# Patient Record
Sex: Male | Born: 1965
Health system: Southern US, Community
[De-identification: ages and names within clinical notes are randomized; demographics above are authoritative.]

## PROBLEM LIST (undated history)

## (undated) DIAGNOSIS — R519 Headache, unspecified: Secondary | ICD-10-CM

## (undated) DIAGNOSIS — E785 Hyperlipidemia, unspecified: Secondary | ICD-10-CM

## (undated) DIAGNOSIS — M1990 Unspecified osteoarthritis, unspecified site: Secondary | ICD-10-CM

## (undated) DIAGNOSIS — G4730 Sleep apnea, unspecified: Secondary | ICD-10-CM

## (undated) DIAGNOSIS — T7840XA Allergy, unspecified, initial encounter: Secondary | ICD-10-CM

## (undated) DIAGNOSIS — K219 Gastro-esophageal reflux disease without esophagitis: Secondary | ICD-10-CM

## (undated) DIAGNOSIS — K759 Inflammatory liver disease, unspecified: Secondary | ICD-10-CM

## (undated) HISTORY — PX: PROSTATE BIOPSY: SHX241

## (undated) HISTORY — DX: Sleep apnea, unspecified: G47.30

## (undated) HISTORY — DX: Hyperlipidemia, unspecified: E78.5

## (undated) HISTORY — PX: OTHER SURGICAL HISTORY: SHX169

## (undated) HISTORY — PX: COLONOSCOPY: SHX174

## (undated) HISTORY — DX: Allergy, unspecified, initial encounter: T78.40XA

---

## 1998-07-10 ENCOUNTER — Encounter: Admission: RE | Admit: 1998-07-10 | Discharge: 1998-07-28 | Payer: Self-pay | Admitting: Anesthesiology

## 2002-01-24 ENCOUNTER — Emergency Department (HOSPITAL_COMMUNITY): Admission: EM | Admit: 2002-01-24 | Discharge: 2002-01-24 | Payer: Self-pay | Admitting: Emergency Medicine

## 2002-03-05 ENCOUNTER — Ambulatory Visit (HOSPITAL_BASED_OUTPATIENT_CLINIC_OR_DEPARTMENT_OTHER): Admission: RE | Admit: 2002-03-05 | Discharge: 2002-03-05 | Payer: Self-pay | Admitting: Otolaryngology

## 2002-06-27 ENCOUNTER — Emergency Department (HOSPITAL_COMMUNITY): Admission: EM | Admit: 2002-06-27 | Discharge: 2002-06-28 | Payer: Self-pay | Admitting: Emergency Medicine

## 2002-10-18 ENCOUNTER — Encounter: Admission: RE | Admit: 2002-10-18 | Discharge: 2002-10-18 | Payer: Self-pay | Admitting: Cardiology

## 2002-10-18 ENCOUNTER — Encounter: Payer: Self-pay | Admitting: Cardiology

## 2005-06-16 ENCOUNTER — Emergency Department (HOSPITAL_COMMUNITY): Admission: EM | Admit: 2005-06-16 | Discharge: 2005-06-16 | Payer: Self-pay | Admitting: Emergency Medicine

## 2005-06-18 ENCOUNTER — Encounter: Admission: RE | Admit: 2005-06-18 | Discharge: 2005-06-18 | Payer: Self-pay | Admitting: Cardiology

## 2005-06-20 ENCOUNTER — Inpatient Hospital Stay (HOSPITAL_COMMUNITY): Admission: AD | Admit: 2005-06-20 | Discharge: 2005-06-23 | Payer: Self-pay | Admitting: Cardiology

## 2006-08-29 ENCOUNTER — Encounter: Admission: RE | Admit: 2006-08-29 | Discharge: 2006-08-29 | Payer: Self-pay | Admitting: Cardiology

## 2011-03-08 NOTE — Discharge Summary (Signed)
[name], Galvan            ACCOUNT NO.:  [id number]   MEDICAL RECORD NO.:  [id number]          PATIENT TYPE:   LOCATION:                                 FACILITY:   PHYSICIAN:  Eric Galvan, M.D.      DATE OF BIRTH:   DATE OF ADMISSION:  06/10/2006  DATE OF DISCHARGE:  05/23/2006                                 DISCHARGE SUMMARY   ADMITTING DIAGNOSES:  1.  Acute hepatitis.  2.  History of hepatitis A.  3.  Dyslipidemia.  4.  Tobacco abuse.   FINAL DIAGNOSES:  1.  Hepatitis C increased LFTs secondary to above.  2.  History of hepatitis A in the past.  3.  Hypercholesteremia controlled by diet, morbid obesity.  4.  Sleep apnea.  5.  Tobacco abuse.   DISCHARGE HOME MEDICATIONS:  1.  Protonix 40 mg 1 tablet daily.  2.  Multivitamin 1 tablet daily.   ACTIVITY:  As tolerated.   DIET:  Low salt, low cholesterol.   Hepatic function panel in 1 week.   FOLLOW UP:  1.  Eric Galvan in 2-4 weeks.  2.  Follow up with Eric Galvan in 1 week.   CONDITION AT DISCHARGE:  Stable.   BRIEF HISTORY AND HOSPITAL COURSE:  Eric Galvan is a 45 year old African  American male with no previous history of liver disease, other than the  history of hepatitis A in 2004.  [facility],  and he was sick for approximately 2 weeks, then got about 2 weeks ago, he  became ill and was beginning to have nausea, bloating, increased his Pepcid,  increased fatigue and pressure-like sensation in lower chest that was  burning consistent with previous reflux symptoms that he had.  This was  worse after eating.  He became somewhat anorexic and fatigued recently.  No  real diarrhea other than a couple of loose stools yesterday.  He works as  Firefighter.  He was in [place] and went to an [facility] in  [place], apparently had x-ray done and some sort of lab work.  He was told  yesterday evening everything was okay.  He was placed on Pepcid.  This was  the  only medication that he has.  He denies any routine medicines.  He  carefully questioned about any over-the-counter medications including herbal  preparation, pain tablets, Tylenol, etc.  He denies all of those.  He denies  using IV drugs, has not traveled outside the contained recent past.  He was  admitted to hospital for further evaluation, as patient was noted to have  elevated bilirubin of 3.9.   CURRENT MEDICATIONS:  Pepcid   ALLERGIES:  PENICILLIN.   He takes no other chronic medication   FAMILY HISTORY:  Both parents are diabetic; no family history of liver  disease.   SOCIAL HISTORY:  He is married and has 2 children.  He smoked 1 pack per  day, drinks alcohol occasionally 1-2 times per week.   PHYSICAL EXAMINATION:  VITAL SIGNS:  On examination, his blood pressure was  119/68.  He was afebrile.  HEENT:  Sclerae was icteric.  Throat normal.  Mucous membranes were moist.  LUNGS:  Clear.  HEART:  Regular rate and rhythm, no murmurs or gallop.  ABDOMEN:  Soft, nontender.  Good bowel sounds.   IMPRESSION:  1.  Abnormal liver function, unknown cause.  2.  Fatigue, malaise, anorexia.  3.  History of hepatitis in the past.   LABORATORY DATA:  Hepatitis B surface antigen was positive.  His total  protein was 6.8.  Serum albumin was 55.6.  Alpha-1 globulin was 5.1 which  was elevated.  Hemoglobin was elevated 19.1.  His hemoglobin was 14.2,  hematocrit 42.6, white count of 6.7, potassium was 3.9, glucose was 102, BUN  7, creatinine 1.2.  His AST was 1055, ALT was 2178, ALP was 171, total bili  was 3.4.  Repeat total bili was 3.9, direct bilirubin was 2.2 and indirect  bilirubin was 1.7.  Hepatic abdominal ultrasound showed a completely  distended gallbladder, however, no evidence of gallstones or biliary  dilatation.  KUB abdominal x-ray showed mild generalized ileus.  His EKG  showed normal sinus rhythm with no acute ischemic changes.   BRIEF HOSPITAL COURSE:  The patient  was admitted to telemetry unit, and GI  consultation was obtained with Eric Galvan.  The patient did not have  any further episodes of abdominal pain, nausea, or vomiting.  The patient  denies any abdominal pain, nausea, vomiting, diarrhea during the hospital  stay.  The patient was advised to be followed up by GI as outpatient and has  appointment with Eric Galvan in 2-4 weeks and will follow up with Eric Galvan  as outpatient in 1 week.           ______________________________  Eric Galvan. Sharyn Galvan, M.D.     [facility]  D:  05/22/2006  T:  05/22/2006  Job:  [id number]   cc:   Eric Galvan, M.D.

## 2011-03-08 NOTE — Discharge Summary (Signed)
NAMESUKHRAJ, ESQUIVIAS             ACCOUNT NO.:  [id number]   MEDICAL RECORD NO.:  [id number]            PATIENT TYPE:   LOCATION:                                 FACILITY:   PHYSICIAN:  Mohan N. Sharyn Lull, M.D.      DATE OF BIRTH:   DATE OF ADMISSION:  DATE OF DISCHARGE:                                 DISCHARGE SUMMARY   ADMISSION DIAGNOSES:  1.  Acute hepatitis.  2.  History of hepatitis A.  3.  Dyslipidemia.  4.  Tobacco abuse.   FINAL DIAGNOSES:  1.  Hepatitis C.  2.  Increased LFT's secondary to above.  3.  Hypercholesterolemia, controlled by diet.  4.  Morbid obesity.  5.  Sleep apnea.  6.  Tobacco abuse.  7.  History of hepatitis A.   DISCHARGE HOME MEDICATIONS:  1.  Protonix 40 mg one tablet daily.  2.  Multivitamins one tablet daily.   ACTIVITY:  As tolerated.   DIET:  Low salt, low cholesterol.   DISCHARGE INSTRUCTIONS:  Hepatic function panel in one week.  Follow with  Dr. Madilyn Fireman in two to four weeks and Dr. Shana Chute in one week.   CONDITION AT DISCHARGE:  Stable.   __________ 45 year old black male was admitted with nausea, anorexia,  weakness for past five days.  Patient also had history of dyslipidemia,  hypertension, generalized weakness lasting four to five days.  Denies any  fever, chills, or vomiting, but has been nauseated.  He has history of  hepatitis A.  He is denies any history of alcohol abuse or drug abuse.  His  symptoms began __________ and progressed __________.           ______________________________  Eric Galvan. Sharyn Lull, M.D.    M[facility]/MED[facility]  D:  05/01/2006  T:  05/02/2006  Job:  [id number]

## 2011-03-08 NOTE — Consult Note (Signed)
Eric Galvan, Eric Galvan             ACCOUNT NO.:  [id number]   MEDICAL RECORD NO.:  [id number]          PATIENT TYPE:  INP   LOCATION:  5148                         FACILITY:  [facility]   PHYSICIAN:  James L. Malon Kindle., M.D.DATE OF BIRTH:  1965-12-22   DATE OF CONSULTATION:  06/21/2005  DATE OF DISCHARGE:                                   CONSULTATION   REASON FOR CONSULTATION:  For abnormal liver test.   HISTORY:  Very nice 45 year old African-American male with no previous  history of liver disease other than a history of hepatitis A in 2004.  This  was diagnosed at an urgent care center, and he was sick for approximately  two weeks then got well.  About two weeks ago, he became ill and was  beginning to have vague nausea, bloating, increasing dyspepsia, water brash  and regurgitation, increasing fatigue, pressure-like sensation in his chest  that was burning consistent with previous reflux symptoms that he had.  It  was worse after eating.  He became somewhat anorexic, fatigued.  No real  diarrhea other than a couple of loose stools yesterday.  He is a Conservation officer, historic buildings.  He was in [place] and went to an urgent care-type center in  [place] and apparently had x-ray done and some sort of lab work.  Was told  everything was okay.  He was placed on Pepcid.  This is the only medication  that he has had.  He adamantly denies any routine medicines.  I carefully  questioned him about any over-the-counter medicines including any herbal  preparations, pain tablets with Tylenol, etc.  He denied all these.  He uses  no IV drugs.  Has not traveled outside the country in the past couple  months.  He has had no sexual or any other exposure to anyone with known  liver disease recently.  He is feeling a little bit better today.  He has  continued to have symptoms.  He was admitted to hospital.  His labs reveal a  total  bilirubin of 3.9, alk phos 166, GOT 990, SPT 2040, albumin level at  3.2.   Cardiac enzymes are negative.  Ultrasound done today showed no dilated  ducts, no gallstones.  Official report is currently pending.   CURRENT MEDICATIONS:  Pepcid, which has only been taken recently.   ALLERGIES:  PENICILLIN.   He takes no other chronic medicines.   MEDICAL HISTORY:  No chronic medical problems.  No previous surgeries.   FAMILY HISTORY:  Both parents have diabetes. No family history of liver  disease.   SOCIAL HISTORY:  He is married and has two children.  He smokes one pack a  day.  Drinks alcohol occasionally one or two times a week or less.   REVIEW OF SYSTEMS:  Unremarkable for sinus problems and allergies.  No  change in bowel habits.  No previous history of ulcers.  He does have  chronic reflux symptoms.   PHYSICAL EXAMINATION:  VITAL SIGNS:  The patient is afebrile.  Blood  pressure 119/68.  GENERAL:  Slightly obese African-American male in no acute  distress.  HEENT:  Eyes:  Sclerae are slightly icteric.  Throat normal.  Mucous  membranes moist.  LUNGS:  Clear.  HEART:  Regular rate and rhythm.  No murmurs or gallops.  ABDOMEN:  Soft, nontender.  Excellent bowel sounds.  The patient indicates  the upper abdomen is where he is having discomfort.   ASSESSMENT:  Abnormal liver test, unknown for cause.  This is associated  with fatigue, malaise, and anorexia.  I think it is probably viral.  It  could be mono or some other virus.  He says he is feeling better.   PLAN:  Will go ahead and obtain hepatitis B and C serologies as well as  Monospot.  Will also obtain ANA, protein electrophoresis as well.  Will  follow him clinically.           ______________________________  Llana Aliment Malon Kindle., M.D.     Waldron Session  D:  06/21/2005  T:  06/21/2005  Job:  [id number]

## 2012-03-23 ENCOUNTER — Encounter: Payer: 59 | Attending: Internal Medicine | Admitting: *Deleted

## 2012-03-23 ENCOUNTER — Encounter: Payer: Self-pay | Admitting: *Deleted

## 2012-03-23 DIAGNOSIS — E119 Type 2 diabetes mellitus without complications: Secondary | ICD-10-CM | POA: Insufficient documentation

## 2012-03-23 DIAGNOSIS — E785 Hyperlipidemia, unspecified: Secondary | ICD-10-CM | POA: Insufficient documentation

## 2012-03-23 DIAGNOSIS — Z713 Dietary counseling and surveillance: Secondary | ICD-10-CM | POA: Insufficient documentation

## 2012-03-23 DIAGNOSIS — I10 Essential (primary) hypertension: Secondary | ICD-10-CM | POA: Insufficient documentation

## 2012-03-23 DIAGNOSIS — E669 Obesity, unspecified: Secondary | ICD-10-CM | POA: Insufficient documentation

## 2012-03-23 NOTE — Patient Instructions (Addendum)
Goals:  Aim for 4 - 5 Carb Choices per meal  (60 - 75 grams carbohydrate/meal  Aim for 0-2 Carb Choices per snack  ( 0-30 grams carbohydrate/snack  Ask insurance about choice of strips if you decide to monitor glucose levels   Aim for 30 mins of physical activity daily as tolerated  Use calendar to plan times to swim several times a week

## 2012-03-23 NOTE — Progress Notes (Signed)
  Medical Nutrition Therapy:  Appt start time: 1130 end time:  1230.  Assessment:  Primary concerns today: patient here for assistance with weight loss and diabetes management education. He states he was diagnosed about 4 years ago and this is his first diabetes education he has received. He has a meter at home but has not tested his BG yet. He works at [employer] 50-60 hours a week.   MEDICATIONS: see list. Diabetes medication is Janumet daily   DIETARY INTAKE:  Usual eating pattern includes 2 meals and 0-2 snacks per day.  Everyday foods include fair variety of most food groups with large portions.  Avoided foods include milk.    24-hr recall:  B ( AM): skip  Snk ( AM): none  L ( PM): eat out- all fast food, water or occasionally soda Snk ( PM): occasional PNB crackers x 1 pkg D ( PM): eat out 50% of time, or wife cooks - meat, 2 veg, bread, water Snk ( PM): occasionally cookies OR chips OR PNB crackers, peanuts Beverages: water mostly, occasional reg or soda or sweet tea  Usual physical activity: not now, would like to start  Estimated energy needs: 2000 calories 225 g carbohydrates 150 g protein 56 g fat  Progress Towards Goal(s):  In progress.   Nutritional Diagnosis:  NI-1.5 Excessive energy intake As related to activity level.  As evidenced by BMI of 37.2% .    Intervention:  Nutrition counseling and diabetes education provided. Discussed basic physiology of diabetes, rationale of SMBG, Carb Counting and reading food labels. Also encouraged him to initiate his choice of exercise which he plans to be swimming at the [facility]. Goals:  Aim for 4 - 5 Carb Choices per meal  (60 - 75 grams carbohydrate/meal  Aim for 0-2 Carb Choices per snack  ( 0-30 grams carbohydrate/snack  Ask insurance about choice of strips if you decide to monitor glucose levels   Aim for 30 mins of physical activity daily as tolerated  Use calendar to plan times to swim several times a  week  Handouts given during visit include: Living Well with Diabetes Carb Counting and Food Label handouts Meal Plan Card  Diabetes Medication List  Monitoring/Evaluation:  Dietary intake, exercise, reading food labels, and body weight prn.

## 2013-02-17 ENCOUNTER — Ambulatory Visit (INDEPENDENT_AMBULATORY_CARE_PROVIDER_SITE_OTHER): Payer: 59 | Admitting: Physician Assistant

## 2013-02-17 VITALS — BP 128/72 | HR 106 | Temp 99.8°F | Resp 16 | Ht 73.5 in | Wt 300.0 lb

## 2013-02-17 DIAGNOSIS — E785 Hyperlipidemia, unspecified: Secondary | ICD-10-CM | POA: Insufficient documentation

## 2013-02-17 DIAGNOSIS — J029 Acute pharyngitis, unspecified: Secondary | ICD-10-CM

## 2013-02-17 DIAGNOSIS — E119 Type 2 diabetes mellitus without complications: Secondary | ICD-10-CM | POA: Insufficient documentation

## 2013-02-17 DIAGNOSIS — J309 Allergic rhinitis, unspecified: Secondary | ICD-10-CM

## 2013-02-17 MED ORDER — AMBULATORY NON FORMULARY MEDICATION: Status: DC

## 2013-02-17 MED ORDER — IPRATROPIUM BROMIDE 0.03 % NA SOLN: 2.0000 | Freq: Two times a day (BID) | NASAL | Status: DC

## 2013-02-17 MED ORDER — GUAIFENESIN ER 1200 MG PO TB12: 1.0000 | ORAL_TABLET | Freq: Two times a day (BID) | ORAL | Status: DC | PRN

## 2013-02-17 NOTE — Progress Notes (Signed)
  Subjective:    Patient ID: Eric Galvan, male    DOB: 10-10-1966, 47 y.o.   MRN: [id number]  HPI This 47 y.o. male presents for evaluation of sore throat x 1 week.  Some mild cough.  Denies post-nasal drainage, runny nose, stuffy nose, ear fullness/pressure/pain, HA, but then states, "My allergies have been terrible this year." No GI/GU symptoms. Denies heartburn/indigestion.  Diabetes is managed by his PCP. Doesn't check glucose at home.  "I can always tell when it's out of whack."  He reports getting really tired with elevated glucose, which he addresses by going to bed.  Has seen a DDS in the past 6 month and eye specialist in the past year.  Review of Systems Denies chest pain, shortness of breath, HA, dizziness, vision change, nausea, vomiting, diarrhea, constipation, melena, hematochezia, dysuria, increased urinary urgency or frequency, increased hunger or thirst, unintentional weight change, unexplained myalgias or arthralgias, rash.     Objective:   Physical Exam Blood pressure 128/72, pulse 106, temperature 99.8 F (37.7 C), resp. rate 16, height 6' 1.5" (1.867 m), weight 300 lb (136.079 kg). Body mass index is 39.04 kg/(m^2). Well-developed, well nourished BM who is awake, alert and oriented, in NAD. HEENT: /AT, PERRL, EOMI.  Sclera and conjunctiva are clear.  EAC are patent, TMs are normal in appearance. Nasal mucosa is pink and moist. OP is clear. Neck: supple, non-tender, no lymphadenopathy, thyromegaly. Heart: RRR, no murmur Lungs: normal effort, CTA Abdomen: normo-active bowel sounds, supple, non-tender, no mass or organomegaly. Extremities: no cyanosis, clubbing or edema. Skin: warm and dry without rash. Psychologic: good mood and appropriate affect, normal speech and behavior.  Results for orders placed in visit on 02/17/13  POCT RAPID STREP A (OFFICE)      Result Value Range   Rapid Strep A Screen Negative  Negative       Assessment & Plan:  Acute  pharyngitis - Plan: POCT rapid strep A, Culture, Group A Strep, Hurricaine Cocktail  Allergic rhinitis - Plan: ipratropium (ATROVENT) 0.03 % nasal spray, Guaifenesin (MUCINEX MAXIMUM STRENGTH) 1200 MG TB12  Fernande Bras, PA-C Physician Assistant-Certified [facility]

## 2013-02-17 NOTE — Patient Instructions (Signed)
Get plenty of rest and drink at least 64 ounces of water daily. Use an OTC antihistamine (Allegra, Claritin or Zyrtec).

## 2013-02-19 LAB — CULTURE, GROUP A STREP: Organism ID, Bacteria: NORMAL

## 2013-06-14 ENCOUNTER — Encounter: Payer: Self-pay | Admitting: Gastroenterology

## 2013-07-09 ENCOUNTER — Ambulatory Visit (INDEPENDENT_AMBULATORY_CARE_PROVIDER_SITE_OTHER): Payer: 59 | Admitting: Gastroenterology

## 2013-07-09 ENCOUNTER — Encounter: Payer: Self-pay | Admitting: Gastroenterology

## 2013-07-09 VITALS — BP 110/62 | HR 88 | Ht 73.0 in | Wt 290.1 lb

## 2013-07-09 DIAGNOSIS — R1310 Dysphagia, unspecified: Secondary | ICD-10-CM

## 2013-07-09 NOTE — Assessment & Plan Note (Signed)
Progressive dysphagia to solids.  Suspect esophageal stricture.  Candida esophagitis is a consideration as well.  Recommendations #1 upper endoscopy with dilatation as indicated  Risks, alternatives, and complications of the procedure, including bleeding, perforation, and possible need for surgery, were explained to the patient.  Patient's questions were answered.

## 2013-07-09 NOTE — Patient Instructions (Addendum)
You have been scheduled for an endoscopy with propofol. Please follow written instructions given to you at your visit today. If you use inhalers (even only as needed), please bring them with you on the day of your procedure. Your physician has requested that you go to www.startemmi.com and enter the access code given to you at your visit today. This web site gives a general overview about your procedure. However, you should still follow specific instructions given to you by our office regarding your preparation for the procedure.  Hold your oral diabetic medications the morning of your procedure

## 2013-07-09 NOTE — Progress Notes (Signed)
History of Present Illness: Pleasant 46 yo Afro-American male with diabetes and sleep apnea referred for evaluation of dysphagia.  He has dysphagia to solids.  He occasionally has odynophagia.  This is a worsening problem for the past 3-4 months.  He denies pyrosis, per se.  Weight is stable.    Past Medical History  Diagnosis Date  . Diabetes mellitus   . Hyperlipidemia   . Sleep apnea   . Allergy    Past Surgical History  Procedure Laterality Date  . Cluster headaches     family history includes Alzheimer's disease in his mother; Cancer in his other; Diabetes in his father and mother; Hypertension in his other. Current Outpatient Prescriptions  Medication Sig Dispense Refill  . AMBULATORY NON FORMULARY MEDICATION MIX: 90 ml Cherry Mylanta, 90 ml Cherry Benadryl, 30 ml Cherry Hurricane liquid.  5-10 ml PO swish and spit or swallow, Q2 hours, prn sore throat.  210 mL  1  . aspirin 81 MG tablet Take 81 mg by mouth daily.      Marland Kitchen atorvastatin (LIPITOR) 80 MG tablet       . clomiPHENE (CLOMID) 50 MG tablet       . ipratropium (ATROVENT) 0.03 % nasal spray Place 2 sprays into the nose 2 (two) times daily.  30 mL  0  . NEXIUM 40 MG capsule       . sitaGLIPtan-metformin (JANUMET) 50-1000 MG per tablet Take 1 tablet by mouth 2 (two) times daily with a meal.       . VIAGRA 100 MG tablet        No current facility-administered medications for this visit.   Allergies as of 07/09/2013 - Review Complete 07/09/2013  Allergen Reaction Noted  . Penicillins  03/23/2012    reports that he quit smoking about 11 years ago. He has never used smokeless tobacco. He reports that he drinks about 1.0 ounces of alcohol per week. His drug history is not on file.     Review of Systems: Pertinent positive and negative review of systems were noted in the above HPI section. All other review of systems were otherwise negative.  Vital signs were reviewed in today's medical record Physical Exam: General:  Well developed , well nourished, no acute distress Skin: anicteric Head: Normocephalic and atraumatic Eyes:  sclerae anicteric, EOMI Ears: Normal auditory acuity Mouth: No deformity or lesions Neck: Supple, no masses or thyromegaly Lungs: Clear throughout to auscultation Heart: Regular rate and rhythm; no murmurs, rubs or bruits Abdomen: Soft, non tender and non distended. No masses, hepatosplenomegaly or hernias noted. Normal Bowel sounds Rectal:deferred Musculoskeletal: Symmetrical with no gross deformities  Skin: No lesions on visible extremities Pulses:  Normal pulses noted Extremities: No clubbing, cyanosis, edema or deformities noted Neurological: Alert oriented x 4, grossly nonfocal Cervical Nodes:  No significant cervical adenopathy Inguinal Nodes: No significant inguinal adenopathy Psychological:  Alert and cooperative. Normal mood and affect

## 2013-07-12 ENCOUNTER — Encounter: Payer: Self-pay | Admitting: Gastroenterology

## 2013-08-03 ENCOUNTER — Encounter: Payer: Self-pay | Admitting: Gastroenterology

## 2013-08-03 ENCOUNTER — Ambulatory Visit (AMBULATORY_SURGERY_CENTER): Payer: 59 | Admitting: Gastroenterology

## 2013-08-03 VITALS — BP 124/72 | HR 77 | Temp 96.5°F | Resp 34 | Ht 73.0 in | Wt 290.0 lb

## 2013-08-03 DIAGNOSIS — R1310 Dysphagia, unspecified: Secondary | ICD-10-CM

## 2013-08-03 MED ORDER — SODIUM CHLORIDE 0.9 % IV SOLN: 500.0000 mL | INTRAVENOUS | Status: DC

## 2013-08-03 NOTE — Progress Notes (Signed)
Called to room to assist during endoscopic procedure.  Patient ID and intended procedure confirmed with present staff. Received instructions for my participation in the procedure from the performing physician.  

## 2013-08-03 NOTE — Progress Notes (Signed)
Patient did not experience any of the following events: a burn prior to discharge; a fall within the facility; wrong site/side/patient/procedure/implant event; or a hospital transfer or hospital admission upon discharge from the facility. (G8907) Patient did not have preoperative order for IV antibiotic SSI prophylaxis. (G8918)  

## 2013-08-03 NOTE — Op Note (Addendum)
[facility] [address] [place], [address]   ENDOSCOPY PROCEDURE REPORT  PATIENT: Eric, Galvan  MR#: [id number] BIRTHDATE: 1966-05-07 , 47  yrs. old GENDER: Male ENDOSCOPIST: Louis Meckel, MD REFERRED BY:  Jackie Plum, M.D. PROCEDURE DATE:  08/03/2013 PROCEDURE:  EGD, diagnostic and Maloney dilation of esophagus ASA CLASS:     Class II INDICATIONS:  Dysphagia. MEDICATIONS: MAC sedation, administered by CRNA, propofol (Diprivan) 200mg  IV, and Simethicone 0.6cc PO TOPICAL ANESTHETIC: Cetacaine Spray  DESCRIPTION OF PROCEDURE: After the risks benefits and alternatives of the procedure were thoroughly explained, informed consent was obtained.  The LB [id number] endoscope was introduced through the mouth and advanced to the third portion of the duodenum. Without limitations.  The instrument was slowly withdrawn as the mucosa was fully examined.      The upper, middle and distal third of the esophagus were carefully inspected and no abnormalities were noted.  The z-line was well seen at the GEJ.  The endoscope was pushed into the fundus which was normal including a retroflexed view.  The antrum, gastric body, first and second part of the duodenum were unremarkable. Retroflexed views revealed no abnormalities.   Because of complains of dysphagia to solids a #52 [place] Maloney dilator was passed. There was mild resistance but no heme.  The scope was then withdrawn from the patient and the procedure completed.  COMPLICATIONS: There were no complications. ENDOSCOPIC IMPRESSION: Normal EGD - status post Maloney dilation  RECOMMENDATIONS: office visit one month REPEAT EXAM:  eSigned:  Louis Meckel, MD 09/10/2013 10:53 AM Revised: 09/10/2013 10:53 AM  CC:

## 2013-08-03 NOTE — Patient Instructions (Signed)
Discharge instructions given with verbal understanding. Handout on a dilatation diet. Resume previous medication. YOU HAD AN ENDOSCOPIC PROCEDURE TODAY AT THE [facility]: Refer to the procedure report that was given to you for any specific questions about what was found during the examination.  If the procedure report does not answer your questions, please call your gastroenterologist to clarify.  If you requested that your care partner not be given the details of your procedure findings, then the procedure report has been included in a sealed envelope for you to review at your convenience later.  YOU SHOULD EXPECT: Some feelings of bloating in the abdomen. Passage of more gas than usual.  Walking can help get rid of the air that was put into your GI tract during the procedure and reduce the bloating. If you had a lower endoscopy (such as a colonoscopy or flexible sigmoidoscopy) you may notice spotting of blood in your stool or on the toilet paper. If you underwent a bowel prep for your procedure, then you may not have a normal bowel movement for a few days.  DIET: Your first meal following the procedure should be a light meal and then it is ok to progress to your normal diet.  A half-sandwich or bowl of soup is an example of a good first meal.  Heavy or fried foods are harder to digest and may make you feel nauseous or bloated.  Likewise meals heavy in dairy and vegetables can cause extra gas to form and this can also increase the bloating.  Drink plenty of fluids but you should avoid alcoholic beverages for 24 hours.  ACTIVITY: Your care partner should take you home directly after the procedure.  You should plan to take it easy, moving slowly for the rest of the day.  You can resume normal activity the day after the procedure however you should NOT DRIVE or use heavy machinery for 24 hours (because of the sedation medicines used during the test).    SYMPTOMS TO REPORT IMMEDIATELY: A  gastroenterologist can be reached at any hour.  During normal business hours, 8:30 AM to 5:00 PM Monday through Friday, call [phone].  After hours and on weekends, please call the GI answering service at [phone] who will take a message and have the physician on call contact you.   Following upper endoscopy (EGD)  Vomiting of blood or coffee ground material  New chest pain or pain under the shoulder blades  Painful or persistently difficult swallowing  New shortness of breath  Fever of 100F or higher  Black, tarry-looking stools  FOLLOW UP: If any biopsies were taken you will be contacted by phone or by letter within the next 1-3 weeks.  Call your gastroenterologist if you have not heard about the biopsies in 3 weeks.  Our staff will call the home number listed on your records the next business day following your procedure to check on you and address any questions or concerns that you may have at that time regarding the information given to you following your procedure. This is a courtesy call and so if there is no answer at the home number and we have not heard from you through the emergency physician on call, we will assume that you have returned to your regular daily activities without incident.  SIGNATURES/CONFIDENTIALITY: You and/or your care partner have signed paperwork which will be entered into your electronic medical record.  These signatures attest to the fact that that the information above   on your After Visit Summary has been reviewed and is understood.  Full responsibility of the confidentiality of this discharge information lies with you and/or your care-partner. 

## 2013-08-04 ENCOUNTER — Telehealth: Payer: Self-pay | Admitting: *Deleted

## 2013-08-04 NOTE — Telephone Encounter (Signed)
  Follow up Call-  Call back number 08/03/2013  Post procedure Call Back phone  # [phone]  Permission to leave phone message Yes     Patient questions:  Do you have a fever, pain , or abdominal swelling? no Pain Score  0 *  Have you tolerated food without any problems? yes  Have you been able to return to your normal activities? yes  Do you have any questions about your discharge instructions: Diet   no Medications  no Follow up visit  no  Do you have questions or concerns about your Care? no  Actions: * If pain score is 4 or above: No action needed, pain <4.

## 2013-09-10 ENCOUNTER — Encounter: Payer: Self-pay | Admitting: Gastroenterology

## 2013-09-10 ENCOUNTER — Ambulatory Visit (INDEPENDENT_AMBULATORY_CARE_PROVIDER_SITE_OTHER): Payer: 59 | Admitting: Gastroenterology

## 2013-09-10 VITALS — BP 104/60 | HR 96 | Ht 72.25 in | Wt 289.5 lb

## 2013-09-10 DIAGNOSIS — Z1211 Encounter for screening for malignant neoplasm of colon: Secondary | ICD-10-CM

## 2013-09-10 DIAGNOSIS — R1310 Dysphagia, unspecified: Secondary | ICD-10-CM

## 2013-09-10 NOTE — Patient Instructions (Signed)
Dr Arlyce Dice has recommended you have a colonoscopy in 1 year

## 2013-09-10 NOTE — Progress Notes (Signed)
History of Present Illness:  The patient has returned following upper endoscopy with dilatation.  No frank stricture was seen but he was dilated with a 52 [place] Maloney dilator.  He reports complete resolution of dysphagia.  His only complaint is an occasional gurgling sound that he thinks is in his upper abdomen or lower chest.  He is without pyrosis.   Review of Systems: Pertinent positive and negative review of systems were noted in the above HPI section. All other review of systems were otherwise negative.    Current Medications, Allergies, Past Medical History, Past Surgical History, Family History and Social History were reviewed in [employer] electronic medical record  Vital signs were reviewed in today's medical record. Physical Exam: General: Well developed , well nourished, no acute distress

## 2013-09-10 NOTE — Assessment & Plan Note (Signed)
Plan repeat dilatation as needed 

## 2013-09-10 NOTE — Assessment & Plan Note (Signed)
I recommended screening colonoscopy sometime within the next year

## 2014-09-19 ENCOUNTER — Encounter: Payer: Self-pay | Admitting: Podiatry

## 2014-09-19 ENCOUNTER — Ambulatory Visit (INDEPENDENT_AMBULATORY_CARE_PROVIDER_SITE_OTHER): Payer: 59 | Admitting: Podiatry

## 2014-09-19 VITALS — BP 143/60 | HR 85 | Resp 14 | Ht 73.0 in | Wt 290.0 lb

## 2014-09-19 DIAGNOSIS — Q828 Other specified congenital malformations of skin: Secondary | ICD-10-CM

## 2014-09-19 DIAGNOSIS — E119 Type 2 diabetes mellitus without complications: Secondary | ICD-10-CM

## 2014-09-19 NOTE — Progress Notes (Signed)
   Subjective:    Patient ID: Eric Galvan, male    DOB: 1966/09/14, 48 y.o.   MRN: [id number]  HPI Comments: N callous L left 5th plantar MPJ D and O long term C hard painful skin A diabetes, and weightbearing T home pedicure  Pt states he noticed a change in the two big toenails, discoloration and splitting, and cutting sensation at the base for over 1 month.     Review of Systems  All other systems reviewed and are negative.      Objective:   Physical Exam  Orientated 3  Vascular: DP pulses 2/4 bilaterally PT pulses 2/4 bilaterally  Neurological: Sensation to 10 g monofilament wire intact 5/5 bilaterally Vibratory sensation intact bilaterally Ankle reflex equal and reactive bilaterally  Dermatological: Transverse ridge right hallux nail plate Striping left hallux nail plate Punctate keratoses in the center of diffuse keratoses plantar fifth left MPJ  Musculoskeletal: HAV deformity left      Assessment & Plan:   Assessment: Satisfactory neurovascular status Porokeratosis fifth left MPJ Reactive plantar keratoses fifth left MPJ Transverse ridge right hallux nail plate noted without any clinical significance Striping left hallux nail plate without any clinical significance  Plan: Patient advised he has satisfactory neurovascular status I did debride the plantar keratoses and packed with salinocaine without a bleeding  I recommended a accommodative prescription shoe insert with a pocket to offload the plantar fifth MPJ corn on a regular basis. Patient said that he did not want to do this and declined the orthotic. I did encourage him to consider this option as it would reduce the discomfort in this area. Also, I advised patient not to sharp debrided the plantar keratoses himself.  I dispensed a surgical felt pad with a cut out to offload the fifth left MPJ and demonstrated how to place the padding in a insole of the shoe for a trial of an accommodative  orthotic.

## 2014-09-19 NOTE — Patient Instructions (Signed)
Use callus file or pumice stone on callus on right foot Attach cut out felt pad to insole of shoe to offload the callus on the right foot Diabetes and Foot Care Diabetes may cause you to have problems because of poor blood supply (circulation) to your feet and legs. This may cause the skin on your feet to become thinner, break easier, and heal more slowly. Your skin may become dry, and the skin may peel and crack. You may also have nerve damage in your legs and feet causing decreased feeling in them. You may not notice minor injuries to your feet that could lead to infections or more serious problems. Taking care of your feet is one of the most important things you can do for yourself.  HOME CARE INSTRUCTIONS  Wear shoes at all times, even in the house. Do not go barefoot. Bare feet are easily injured.  Check your feet daily for blisters, cuts, and redness. If you cannot see the bottom of your feet, use a mirror or ask someone for help.  Wash your feet with warm water (do not use hot water) and mild soap. Then pat your feet and the areas between your toes until they are completely dry. Do not soak your feet as this can dry your skin.  Apply a moisturizing lotion or petroleum jelly (that does not contain alcohol and is unscented) to the skin on your feet and to dry, brittle toenails. Do not apply lotion between your toes.  Trim your toenails straight across. Do not dig under them or around the cuticle. File the edges of your nails with an emery board or nail file.  Do not cut corns or calluses or try to remove them with medicine.  Wear clean socks or stockings every day. Make sure they are not too tight. Do not wear knee-high stockings since they may decrease blood flow to your legs.  Wear shoes that fit properly and have enough cushioning. To break in new shoes, wear them for just a few hours a day. This prevents you from injuring your feet. Always look in your shoes before you put them on to be  sure there are no objects inside.  Do not cross your legs. This may decrease the blood flow to your feet.  If you find a minor scrape, cut, or break in the skin on your feet, keep it and the skin around it clean and dry. These areas may be cleansed with mild soap and water. Do not cleanse the area with peroxide, alcohol, or iodine.  When you remove an adhesive bandage, be sure not to damage the skin around it.  If you have a wound, look at it several times a day to make sure it is healing.  Do not use heating pads or hot water bottles. They may burn your skin. If you have lost feeling in your feet or legs, you may not know it is happening until it is too late.  Make sure your health care provider performs a complete foot exam at least annually or more often if you have foot problems. Report any cuts, sores, or bruises to your health care provider immediately. SEEK MEDICAL CARE IF:   You have an injury that is not healing.  You have cuts or breaks in the skin.  You have an ingrown nail.  You notice redness on your legs or feet.  You feel burning or tingling in your legs or feet.  You have pain or cramps in  your legs and feet.  Your legs or feet are numb.  Your feet always feel cold. SEEK IMMEDIATE MEDICAL CARE IF:   There is increasing redness, swelling, or pain in or around a wound.  There is a red line that goes up your leg.  Pus is coming from a wound.  You develop a fever or as directed by your health care provider.  You notice a bad smell coming from an ulcer or wound. Document Released: 10/04/2000 Document Revised: 06/09/2013 Document Reviewed: 03/16/2013 [facility] Patient Information 2015 [place], [place]. This information is not intended to replace advice given to you by your health care provider. Make sure you discuss any questions you have with your health care provider.

## 2014-09-20 ENCOUNTER — Encounter: Payer: Self-pay | Admitting: Podiatry

## 2015-10-31 ENCOUNTER — Telehealth: Payer: Self-pay

## 2015-10-31 DIAGNOSIS — R079 Chest pain, unspecified: Secondary | ICD-10-CM

## 2015-10-31 NOTE — Telephone Encounter (Signed)
-----   Message from Belva Crome, MD sent at 10/30/2015 11:55 AM EST ----- Regarding: Needs OV and stress cardiolite scheduled ASAP Hi Golda Zavalza, Dr. Warnell Bureau has referred Mr. Shuey for recurring chest pain. He is diabetic and needs a stress cardiolite ASAP and OV   HS

## 2015-10-31 NOTE — Telephone Encounter (Signed)
Spoke with pt. Pt aware of Dr.Smith's message below. Pt agreeable to have stress myoview and work in consult appt with Dr.Smith after. Adv pt that a scheduler from our office will call him to scheduled his myoview. Pt rqst I leave a detailed message on his voicemail with his pre-test instructions.  Adv pt that we will call back to schedule his consult after his myoview appt is made. Pt verbalized understanding.

## 2015-11-01 ENCOUNTER — Telehealth (HOSPITAL_COMMUNITY): Payer: Self-pay | Admitting: *Deleted

## 2015-11-01 NOTE — Telephone Encounter (Signed)
Pt last o/v with Dr.Schooler requested from [place]. Spoke with [employer], she sts that pt has not been seen in over 2 years, she will fax over what she has

## 2015-11-01 NOTE — Telephone Encounter (Signed)
The patient was referred by Dr. Warnell Bureau, [facility]. The patient has been having recurring chest fullness particularly postprandial. The patient has multiple risk factors for CAD including diabetes mellitus, hypertension, obesity, family history of CAD, and hyperlipidemia. After speaking with Dr. Michail Sermon he has referred the patient for cardiac evaluation. He needs to have upper GI endoscopy but we need to rule out ischemic heart disease prior. Therefore, I am recommending a myocardial perfusion study to exclude CAD.

## 2015-11-01 NOTE — Telephone Encounter (Signed)
-----   Message from Jeanie Sewer sent at 10/31/2015  2:31 PM EST ----- Regarding: RE: pt needs stress myoview 01/12 @ 12:30 part 1 01/13 @ 11:30 part 2 Due to weight this is a 2 day test.  Thanks  Davy Pique  ----- Message -----    From: Lamar Laundry, CMA    Sent: 10/31/2015   1:59 PM      To: [facility] Subject: pt needs stress myoview                        Per Dr.Smith Please call pt to scheduled a stress myoview, asap.

## 2015-11-01 NOTE — Telephone Encounter (Signed)
Patient given detailed instructions per Myocardial Perfusion Study Information Sheet for the test on 11/02/15 at 1230. Patient notified to arrive 15 minutes early and that it is imperative to arrive on time for appointment to keep from having the test rescheduled.  If you need to cancel or reschedule your appointment, please call the office within 24 hours of your appointment. Failure to do so may result in a cancellation of your appointment, and a $50 no show fee. Patient verbalized understanding.Maize Brittingham, Ranae Palms

## 2015-11-02 ENCOUNTER — Ambulatory Visit (HOSPITAL_COMMUNITY): Payer: 59 | Attending: Cardiology

## 2015-11-02 DIAGNOSIS — R079 Chest pain, unspecified: Secondary | ICD-10-CM | POA: Diagnosis present

## 2015-11-02 DIAGNOSIS — E119 Type 2 diabetes mellitus without complications: Secondary | ICD-10-CM | POA: Diagnosis not present

## 2015-11-02 MED ORDER — TECHNETIUM TC 99M SESTAMIBI GENERIC - CARDIOLITE
30.9000 | Freq: Once | INTRAVENOUS | Status: AC | PRN
  Administered 2015-11-02: 30.9 via INTRAVENOUS

## 2015-11-03 ENCOUNTER — Ambulatory Visit (HOSPITAL_COMMUNITY): Payer: 59 | Attending: Cardiovascular Disease

## 2015-11-03 LAB — MYOCARDIAL PERFUSION IMAGING
CHL CUP MPHR: 169 {beats}/min
CHL CUP NUCLEAR SSS: 3
CSEPED: 7 min
CSEPEDS: 16 s
CSEPEW: 8.9 METS
CSEPPBP: 92 mmHg
LHR: 0.28
LVDIAVOL: 143 mL
LVSYSVOL: 62 mL
NUC STRESS TID: 0.93
Peak HR: 157 {beats}/min
Percent HR: 92 %
RPE: 19
Rest HR: 90 {beats}/min
SDS: 2
SRS: 1

## 2015-11-03 MED ORDER — TECHNETIUM TC 99M SESTAMIBI GENERIC - CARDIOLITE
30.5000 | Freq: Once | INTRAVENOUS | Status: AC | PRN
  Administered 2015-11-03: 31 via INTRAVENOUS

## 2015-11-06 NOTE — Telephone Encounter (Signed)
Pt aware of myoview results. Stress study is low risk. Overall very reassuring. We will fwd results to Dr. Warnell Bureau. Pt voiced appreciation and verbalized understanding.

## 2015-11-06 NOTE — Telephone Encounter (Signed)
-----   Message from Belva Crome, MD sent at 11/04/2015 11:11 AM EST ----- Stress study is low risk. Overall very reassuring. Please forward results to Dr. Warnell Bureau.

## 2016-10-28 DIAGNOSIS — J019 Acute sinusitis, unspecified: Secondary | ICD-10-CM | POA: Diagnosis not present

## 2016-10-28 DIAGNOSIS — R05 Cough: Secondary | ICD-10-CM | POA: Diagnosis not present

## 2016-10-28 DIAGNOSIS — J22 Unspecified acute lower respiratory infection: Secondary | ICD-10-CM | POA: Diagnosis not present

## 2016-10-31 DIAGNOSIS — J329 Chronic sinusitis, unspecified: Secondary | ICD-10-CM | POA: Diagnosis not present

## 2016-10-31 DIAGNOSIS — E785 Hyperlipidemia, unspecified: Secondary | ICD-10-CM | POA: Diagnosis not present

## 2016-10-31 DIAGNOSIS — E119 Type 2 diabetes mellitus without complications: Secondary | ICD-10-CM | POA: Diagnosis not present

## 2016-10-31 DIAGNOSIS — R05 Cough: Secondary | ICD-10-CM | POA: Diagnosis not present

## 2017-04-22 DIAGNOSIS — K219 Gastro-esophageal reflux disease without esophagitis: Secondary | ICD-10-CM | POA: Diagnosis not present

## 2017-06-02 DIAGNOSIS — Z131 Encounter for screening for diabetes mellitus: Secondary | ICD-10-CM | POA: Diagnosis not present

## 2017-06-02 DIAGNOSIS — E119 Type 2 diabetes mellitus without complications: Secondary | ICD-10-CM | POA: Diagnosis not present

## 2017-06-02 DIAGNOSIS — Z136 Encounter for screening for cardiovascular disorders: Secondary | ICD-10-CM | POA: Diagnosis not present

## 2017-06-02 DIAGNOSIS — E785 Hyperlipidemia, unspecified: Secondary | ICD-10-CM | POA: Diagnosis not present

## 2017-06-02 DIAGNOSIS — Z0110 Encounter for examination of ears and hearing without abnormal findings: Secondary | ICD-10-CM | POA: Diagnosis not present

## 2017-06-02 DIAGNOSIS — Z125 Encounter for screening for malignant neoplasm of prostate: Secondary | ICD-10-CM | POA: Diagnosis not present

## 2017-06-02 DIAGNOSIS — E1140 Type 2 diabetes mellitus with diabetic neuropathy, unspecified: Secondary | ICD-10-CM | POA: Diagnosis not present

## 2017-06-02 DIAGNOSIS — Z0000 Encounter for general adult medical examination without abnormal findings: Secondary | ICD-10-CM | POA: Diagnosis not present

## 2017-10-15 DIAGNOSIS — G4733 Obstructive sleep apnea (adult) (pediatric): Secondary | ICD-10-CM | POA: Diagnosis not present

## 2017-11-14 DIAGNOSIS — E119 Type 2 diabetes mellitus without complications: Secondary | ICD-10-CM | POA: Diagnosis not present

## 2017-11-14 DIAGNOSIS — I10 Essential (primary) hypertension: Secondary | ICD-10-CM | POA: Diagnosis not present

## 2017-11-14 DIAGNOSIS — E785 Hyperlipidemia, unspecified: Secondary | ICD-10-CM | POA: Diagnosis not present

## 2017-12-04 DIAGNOSIS — E119 Type 2 diabetes mellitus without complications: Secondary | ICD-10-CM | POA: Diagnosis not present

## 2017-12-04 DIAGNOSIS — E1140 Type 2 diabetes mellitus with diabetic neuropathy, unspecified: Secondary | ICD-10-CM | POA: Diagnosis not present

## 2017-12-04 DIAGNOSIS — E785 Hyperlipidemia, unspecified: Secondary | ICD-10-CM | POA: Diagnosis not present

## 2017-12-04 DIAGNOSIS — I10 Essential (primary) hypertension: Secondary | ICD-10-CM | POA: Diagnosis not present

## 2018-03-26 DIAGNOSIS — D170 Benign lipomatous neoplasm of skin and subcutaneous tissue of head, face and neck: Secondary | ICD-10-CM | POA: Diagnosis not present

## 2018-03-26 DIAGNOSIS — J208 Acute bronchitis due to other specified organisms: Secondary | ICD-10-CM | POA: Diagnosis not present

## 2018-04-08 ENCOUNTER — Other Ambulatory Visit: Payer: Self-pay | Admitting: Physician Assistant

## 2018-04-08 DIAGNOSIS — K219 Gastro-esophageal reflux disease without esophagitis: Secondary | ICD-10-CM

## 2018-04-08 DIAGNOSIS — R1310 Dysphagia, unspecified: Secondary | ICD-10-CM

## 2018-04-15 ENCOUNTER — Ambulatory Visit
Admission: RE | Admit: 2018-04-15 | Discharge: 2018-04-15 | Disposition: A | Discharge status: Home / Self Care | Payer: 59 | Source: Ambulatory Visit | Attending: Physician Assistant | Admitting: Physician Assistant

## 2018-04-15 DIAGNOSIS — K449 Diaphragmatic hernia without obstruction or gangrene: Secondary | ICD-10-CM | POA: Diagnosis not present

## 2018-04-15 DIAGNOSIS — K219 Gastro-esophageal reflux disease without esophagitis: Secondary | ICD-10-CM | POA: Diagnosis not present

## 2018-04-15 DIAGNOSIS — R1310 Dysphagia, unspecified: Secondary | ICD-10-CM

## 2018-06-04 ENCOUNTER — Encounter: Payer: Self-pay | Admitting: Family Medicine

## 2018-06-04 ENCOUNTER — Ambulatory Visit: Payer: 59 | Admitting: Family Medicine

## 2018-06-04 DIAGNOSIS — M546 Pain in thoracic spine: Secondary | ICD-10-CM | POA: Diagnosis not present

## 2018-06-04 NOTE — Progress Notes (Signed)
Eric Galvan - 52 y.o. male MRN [id number]  Date of birth: [birth year]  SUBJECTIVE:  Including CC & ROS.  Chief Complaint  Patient presents with  . Upper back pain    Eric Galvan is a 52 y.o. male that is presenting with upper back pain. Pain has been ongoing for one month. Pain is located across his trapezius. Pain is constant in nature, mild in nature with movement. Denies radiculopathy. Denies injury or trauma. Denies prior surgeries. Denies tingling or numbness. He has been taking motrin and applying heat. He works at a desk all day. He hasn't had formal physical therapy. No inciting event.    Review of Systems  Constitutional: Negative for fever.  HENT: Negative for congestion.   Respiratory: Negative for cough.   Cardiovascular: Negative for chest pain.  Gastrointestinal: Negative for abdominal pain.  Musculoskeletal: Positive for back pain.  Skin: Negative for color change.  Neurological: Negative for weakness.  Hematological: Negative for adenopathy.  Psychiatric/Behavioral: Negative for agitation.    HISTORY: Past Medical, Surgical, Social, and Family History Reviewed & Updated per EMR.   Pertinent Historical Findings include:  Past Medical History:  Diagnosis Date  . Allergy   . Diabetes mellitus   . Hyperlipidemia   . Sleep apnea     Past Surgical History:  Procedure Laterality Date  . cluster headaches      Allergies  Allergen Reactions  . Other Shortness Of Breath and Swelling    shellfish  . Penicillins     Family History  Problem Relation Age of Onset  . Cancer Other   . Hypertension Other   . Diabetes Mother   . Alzheimer's disease Mother   . Diabetes Father      Social History   Socioeconomic History  . Marital status: Married    Spouse name: Not on file  . Number of children: 2  . Years of education: Not on file  . Highest education level: Not on file  Occupational History  . Occupation: Engineer, maintenance    Comment: [employer]  Social Needs  . Financial resource strain: Not on file  . Food insecurity:    Worry: Not on file    Inability: Not on file  . Transportation needs:    Medical: Not on file    Non-medical: Not on file  Tobacco Use  . Smoking status: Former Smoker    Last attempt to quit: 03/23/2002    Years since quitting: 16.2  . Smokeless tobacco: Never Used  Substance and Sexual Activity  . Alcohol use: Yes    Alcohol/week: 2.0 standard drinks    Types: 2 drink(s) per week    Comment: mixed drinks  . Drug use: No  . Sexual activity: Not on file  Lifestyle  . Physical activity:    Days per week: Not on file    Minutes per session: Not on file  . Stress: Not on file  Relationships  . Social connections:    Talks on phone: Not on file    Gets together: Not on file    Attends religious service: Not on file    Active member of club or organization: Not on file    Attends meetings of clubs or organizations: Not on file    Relationship status: Not on file  . Intimate partner violence:    Fear of current or ex partner: Not on file    Emotionally abused: Not on file    Physically abused:  Not on file    Forced sexual activity: Not on file  Other Topics Concern  . Not on file  Social History Narrative   Lives with his wife and one of their two children.     PHYSICAL EXAM:  VS: BP 122/68 (BP Location: Left Arm, Patient Position: Sitting, Cuff Size: Normal)   Pulse 86   Ht 6\' 1"  (1.854 m)   Wt 274 lb (124.3 kg)   SpO2 98%   BMI 36.15 kg/m  Physical Exam Gen: NAD, alert, cooperative with exam, well-appearing ENT: normal lips, normal nasal mucosa,  Eye: normal EOM, normal conjunctiva and lids CV:  no edema, +2 pedal pulses   Resp: no accessory muscle use, non-labored,  Skin: no rashes, no areas of induration  Neuro: normal tone, normal sensation to touch Psych:  normal insight, alert and oriented MSK:  Neck:  TTP along the paraspinal muscles in the upper thoracic.  No  TTP to  the midline cervicalor paracervical muscles  Normal neck ROM  Normal strength to resistance with shrug Normal grip strength  No signs of atrophy  Neurovascularly intact.    Aspiration/Injection Procedure Note YAMAN GRAUBERGER 11-07-1965  Procedure: Injection Indications: upper thoracic back pain/trigger points   Procedure Details Consent: Risks of procedure as well as the alternatives and risks of each were explained to the (patient/caregiver).  Consent for procedure obtained. Time Out: Verified patient identification, verified procedure, site/side was marked, verified correct patient position, special equipment/implants available, medications/allergies/relevent history reviewed, required imaging and test results available.  Performed.  The area was cleaned with iodine and alcohol swabs.    The left and right trapezius was injected in 4 areas using 1 cc's of 40 mg Depomedrol and 4 cc's of 1% lidocaine with a 25 1" needle.    A sterile dressing was applied.  Patient did tolerate procedure well.    ASSESSMENT & PLAN:   Back pain Likely related to his posture at work. No radicular symptoms. No inciting event.  - trigger point injections today  - counseled on HEP  - counseled on on ergonomics at work  - provided pennsaid samples  - if no improvement consider imaging, PT, muscle relaxer, manipulation

## 2018-06-04 NOTE — Patient Instructions (Signed)
Nice to meet you  Please try the exercises  Please let me know if you would like to try more of the medicine that I have provided  Please follow up with me in 3-4 weeks if your pain hasn't improved.

## 2018-06-05 ENCOUNTER — Encounter: Payer: Self-pay | Admitting: Family Medicine

## 2018-06-05 DIAGNOSIS — M549 Dorsalgia, unspecified: Secondary | ICD-10-CM | POA: Insufficient documentation

## 2018-06-05 DIAGNOSIS — D170 Benign lipomatous neoplasm of skin and subcutaneous tissue of head, face and neck: Secondary | ICD-10-CM | POA: Diagnosis not present

## 2018-06-05 DIAGNOSIS — R220 Localized swelling, mass and lump, head: Secondary | ICD-10-CM | POA: Diagnosis not present

## 2018-06-05 NOTE — Assessment & Plan Note (Signed)
Likely related to his posture at work. No radicular symptoms. No inciting event.  - trigger point injections today  - counseled on HEP  - counseled on on ergonomics at work  - provided pennsaid samples  - if no improvement consider imaging, PT, muscle relaxer, manipulation

## 2018-06-08 ENCOUNTER — Telehealth: Payer: Self-pay | Admitting: Family Medicine

## 2018-06-08 NOTE — Telephone Encounter (Signed)
Copied from [place] [id number]. Topic: Quick Communication - See Telephone Encounter >> Jun 08, 2018  2:56 PM Sheran Luz wrote: Pt states that during his last visit he was given back injections for pain. He stated that the injections did not work for him and would like to speak with a nurse or Dr. Tamala Julian to see what he can do next. Please advise.  Best Cb# [phone]

## 2018-06-09 MED ORDER — METHOCARBAMOL 500 MG PO TABS: 500.0000 mg | ORAL_TABLET | Freq: Two times a day (BID) | ORAL | 0 refills | Status: DC

## 2018-06-09 NOTE — Telephone Encounter (Signed)
Spoke with patient. Will try muscle relaxer. May need to send for PT. May need to do imaging.   Rosemarie Ax, MD [facility] 06/09/2018, 9:37 AM

## 2018-06-17 ENCOUNTER — Telehealth: Payer: Self-pay | Admitting: *Deleted

## 2018-06-17 DIAGNOSIS — M546 Pain in thoracic spine: Secondary | ICD-10-CM

## 2018-06-17 NOTE — Telephone Encounter (Signed)
Copied from [place] [id number]. Topic: Quick Communication - See Telephone Encounter >> Jun 17, 2018 10:42 AM Antonieta Iba C wrote: CRM for notification. See Telephone encounter for: 06/17/18.  Pt called in to make Dr. Tamala Julian aware that the muscle relaxer that he prescribed is not helping and also pt would like to go ahead with having imaging orders placed for his back pain. Pt says that he and provider discussed in ov    CB: [phone]

## 2018-06-17 NOTE — Telephone Encounter (Signed)
Pt saw Dr. Raeford Razor.

## 2018-06-18 ENCOUNTER — Ambulatory Visit (INDEPENDENT_AMBULATORY_CARE_PROVIDER_SITE_OTHER)
Admission: RE | Admit: 2018-06-18 | Discharge: 2018-06-18 | Disposition: A | Discharge status: Home / Self Care | Payer: 59 | Source: Ambulatory Visit | Attending: Family Medicine | Admitting: Family Medicine

## 2018-06-18 ENCOUNTER — Other Ambulatory Visit: Payer: Self-pay | Admitting: Gastroenterology

## 2018-06-18 DIAGNOSIS — M546 Pain in thoracic spine: Secondary | ICD-10-CM

## 2018-06-18 DIAGNOSIS — K219 Gastro-esophageal reflux disease without esophagitis: Secondary | ICD-10-CM | POA: Diagnosis not present

## 2018-06-18 DIAGNOSIS — M50322 Other cervical disc degeneration at C5-C6 level: Secondary | ICD-10-CM | POA: Diagnosis not present

## 2018-06-18 DIAGNOSIS — M4185 Other forms of scoliosis, thoracolumbar region: Secondary | ICD-10-CM | POA: Diagnosis not present

## 2018-06-18 DIAGNOSIS — R1310 Dysphagia, unspecified: Secondary | ICD-10-CM | POA: Diagnosis not present

## 2018-06-18 NOTE — Telephone Encounter (Signed)
Spoke with patient and will order xrays. May need to do PT next.   Rosemarie Ax, MD [facility] 06/18/2018, 1:16 PM

## 2018-06-19 ENCOUNTER — Telehealth: Payer: Self-pay | Admitting: Family Medicine

## 2018-06-19 DIAGNOSIS — M546 Pain in thoracic spine: Secondary | ICD-10-CM

## 2018-06-19 NOTE — Telephone Encounter (Signed)
Spoke with patient about his results. Will refer to PT.   Rosemarie Ax, MD [facility] 06/19/2018, 5:27 PM

## 2018-06-25 ENCOUNTER — Encounter: Payer: Self-pay | Admitting: *Deleted

## 2018-06-25 ENCOUNTER — Telehealth: Payer: Self-pay | Admitting: *Deleted

## 2018-06-25 DIAGNOSIS — R0789 Other chest pain: Secondary | ICD-10-CM

## 2018-06-25 MED ORDER — METOPROLOL TARTRATE 50 MG PO TABS: ORAL_TABLET | ORAL | 0 refills | Status: DC

## 2018-06-25 NOTE — Addendum Note (Signed)
Addended by: Loren Racer on: 06/25/2018 11:20 AM   Modules accepted: Orders

## 2018-06-25 NOTE — Addendum Note (Signed)
Addended by: Loren Racer on: 06/25/2018 11:17 AM   Modules accepted: Orders

## 2018-06-25 NOTE — Telephone Encounter (Signed)
Spoke with pt and went over orders and recommendations per Dr. Tamala Julian.  Pt in agreement with plan. Advised I will speak with CT scheduler and see about getting this expedited in preparation for his procedure.  Pt will come here for labs prior.

## 2018-06-25 NOTE — Telephone Encounter (Signed)
-----   Message from Belva Crome, MD sent at 06/24/2018 11:17 PM EDT ----- We should contact him about having a coronary CT angio with Morphology and , Let him know Dr. Michail Sermon wanted heart evaluation ----- Message ----- From: Loren Racer, LPN Sent: 06/24/4067   9:45 AM EDT To: Belva Crome, MD  Just an Juluis Rainier.  I haven't seen an order come through from Dr. Michail Sermon yet but I do see the pt is scheduled for Esophageal Manometry on 07/08/18.  Wasn't sure if you were aware.    ----- Message ----- From: Loren Racer, LPN Sent: 01/22/3532   5:24 PM EDT To: Loren Racer, LPN  Watch for ETT order from Dr. Michail Sermon

## 2018-06-26 ENCOUNTER — Telehealth: Payer: Self-pay | Admitting: Family Medicine

## 2018-06-26 NOTE — Telephone Encounter (Signed)
Physical therapy left message on patient's VM to get appointment scheduled for physical therapy. Has not been able to get in contact with patient. Please advise

## 2018-06-26 NOTE — Telephone Encounter (Signed)
Copied from [place] [id number]. Topic: Referral - Status >> Jun 26, 2018 11:12 AM [name] wrote: Pt is calling to check status of a physical therapy referral that was requested on 06/19/18 and now he is wanting to also get an mri of his back as soon as he can   Best number  [phone]

## 2018-06-29 ENCOUNTER — Other Ambulatory Visit: Payer: 59 | Admitting: *Deleted

## 2018-06-29 DIAGNOSIS — R0789 Other chest pain: Secondary | ICD-10-CM

## 2018-06-29 DIAGNOSIS — Z125 Encounter for screening for malignant neoplasm of prostate: Secondary | ICD-10-CM | POA: Diagnosis not present

## 2018-06-29 DIAGNOSIS — N5201 Erectile dysfunction due to arterial insufficiency: Secondary | ICD-10-CM | POA: Diagnosis not present

## 2018-06-29 LAB — BASIC METABOLIC PANEL
BUN/Creatinine Ratio: 13 (ref 9–20)
BUN: 11 mg/dL (ref 6–24)
CHLORIDE: 101 mmol/L (ref 96–106)
CO2: 24 mmol/L (ref 20–29)
Calcium: 9.4 mg/dL (ref 8.7–10.2)
Creatinine, Ser: 0.84 mg/dL (ref 0.76–1.27)
GFR calc Af Amer: 115 mL/min/{1.73_m2} (ref >59)
GFR, EST NON AFRICAN AMERICAN: 99 mL/min/{1.73_m2} (ref >59)
GLUCOSE: 131 mg/dL — AB (ref 65–99)
POTASSIUM: 4.4 mmol/L (ref 3.5–5.2)
SODIUM: 139 mmol/L (ref 134–144)

## 2018-06-30 ENCOUNTER — Telehealth: Payer: Self-pay | Admitting: *Deleted

## 2018-06-30 DIAGNOSIS — M542 Cervicalgia: Secondary | ICD-10-CM

## 2018-06-30 NOTE — Telephone Encounter (Signed)
Unable to leave VM for patient. If he calls back please have him speak with a nurse/CMA and inform that physical therapy is the next best step to help with his symptoms. An MRI would be more of benefit if he had pain going down his arms. The [facility] can report results to patient.   If any questions then please take the best time and phone number to call and I will try to call her back.   Rosemarie Ax, MD [facility] 06/30/2018, 10:25 AM

## 2018-06-30 NOTE — Telephone Encounter (Signed)
Spoke with patient he declined physical therapy, wishes to proceed with MRI. Please advise   Copied from [place] [id number]. Topic: Referral - Status >> Jun 26, 2018 11:12 AM [name] wrote: Pt is calling to check status of a physical therapy referral that was requested on 06/19/18 and now he is wanting to also get an mri of his back as soon as he can   Best number  [phone] >> Jun 30, 2018 11:51 AM Oliver Pila B wrote: Pt called to get an MRI done on back and neck area and is wanting this done right away; contact to advise

## 2018-07-02 NOTE — Telephone Encounter (Signed)
Left VM for patient. If he calls back please have him speak with a nurse/CMA and inform that we can try an MRI. His insurance may deny it if we don't pursue a course of physical therapy first. The [facility] can report results to patient.   If any questions then please take the best time and phone number to call and I will try to call him back.   Rosemarie Ax, MD [facility] 07/02/2018, 9:09 AM

## 2018-07-07 ENCOUNTER — Ambulatory Visit (HOSPITAL_COMMUNITY): Admission: RE | Admit: 2018-07-07 | Payer: 59 | Source: Ambulatory Visit

## 2018-07-07 ENCOUNTER — Ambulatory Visit (HOSPITAL_COMMUNITY)
Admission: RE | Admit: 2018-07-07 | Discharge: 2018-07-07 | Disposition: A | Discharge status: Home / Self Care | Payer: 59 | Source: Ambulatory Visit | Attending: Interventional Cardiology | Admitting: Interventional Cardiology

## 2018-07-07 ENCOUNTER — Encounter (HOSPITAL_COMMUNITY): Payer: Self-pay | Admitting: *Deleted

## 2018-07-07 DIAGNOSIS — I2510 Atherosclerotic heart disease of native coronary artery without angina pectoris: Secondary | ICD-10-CM | POA: Diagnosis not present

## 2018-07-07 DIAGNOSIS — R072 Precordial pain: Secondary | ICD-10-CM

## 2018-07-07 DIAGNOSIS — R0789 Other chest pain: Secondary | ICD-10-CM | POA: Insufficient documentation

## 2018-07-07 MED ORDER — NITROGLYCERIN 0.4 MG SL SUBL
SUBLINGUAL_TABLET | SUBLINGUAL | Status: AC
  Administered 2018-07-07: 0.8 mg via SUBLINGUAL
  Filled 2018-07-07: qty 2

## 2018-07-07 MED ORDER — METOPROLOL TARTRATE 5 MG/5ML IV SOLN
5.0000 mg | INTRAVENOUS | Status: AC | PRN
  Administered 2018-07-07 (×2): 5 mg via INTRAVENOUS

## 2018-07-07 MED ORDER — METOPROLOL TARTRATE 5 MG/5ML IV SOLN
INTRAVENOUS | Status: AC
  Administered 2018-07-07: 5 mg via INTRAVENOUS
  Filled 2018-07-07: qty 15

## 2018-07-07 MED ORDER — NITROGLYCERIN 0.4 MG SL SUBL
0.8000 mg | SUBLINGUAL_TABLET | Freq: Once | SUBLINGUAL | Status: AC
  Administered 2018-07-07: 0.8 mg via SUBLINGUAL
  Filled 2018-07-07: qty 25

## 2018-07-07 MED ORDER — IOPAMIDOL (ISOVUE-370) INJECTION 76%
100.0000 mL | Freq: Once | INTRAVENOUS | Status: AC | PRN
  Administered 2018-07-07: 100 mL via INTRAVENOUS

## 2018-07-07 NOTE — Progress Notes (Signed)
Spoke with patient about coming for procedure.  Patient denied any questions and encouraged to arrive between 10 and 10:15.  Stated "I'll be there."

## 2018-07-07 NOTE — Progress Notes (Signed)
Spoke with patient via telephone for pre procedure interview. History complete. No medications AM of surgery. Pt does have beta blocker on med list (Metoprolol) but stated he does not take it daily. BMP in epic.

## 2018-07-08 ENCOUNTER — Encounter (HOSPITAL_COMMUNITY)
Admission: RE | Disposition: A | Discharge status: Home / Self Care | Payer: Self-pay | Source: Ambulatory Visit | Attending: Gastroenterology

## 2018-07-08 ENCOUNTER — Ambulatory Visit: Payer: 59 | Attending: Family Medicine | Admitting: Physical Therapy

## 2018-07-08 ENCOUNTER — Encounter: Payer: Self-pay | Admitting: Physical Therapy

## 2018-07-08 ENCOUNTER — Ambulatory Visit (HOSPITAL_COMMUNITY)
Admission: RE | Admit: 2018-07-08 | Discharge: 2018-07-08 | Disposition: A | Discharge status: Home / Self Care | Payer: 59 | Source: Ambulatory Visit | Attending: Gastroenterology | Admitting: Gastroenterology

## 2018-07-08 DIAGNOSIS — R252 Cramp and spasm: Secondary | ICD-10-CM | POA: Diagnosis not present

## 2018-07-08 DIAGNOSIS — M542 Cervicalgia: Secondary | ICD-10-CM | POA: Insufficient documentation

## 2018-07-08 DIAGNOSIS — M546 Pain in thoracic spine: Secondary | ICD-10-CM | POA: Insufficient documentation

## 2018-07-08 DIAGNOSIS — K219 Gastro-esophageal reflux disease without esophagitis: Secondary | ICD-10-CM | POA: Insufficient documentation

## 2018-07-08 HISTORY — PX: PH IMPEDANCE STUDY: SHX5565

## 2018-07-08 HISTORY — PX: ESOPHAGEAL MANOMETRY: SHX5429

## 2018-07-08 SURGERY — MANOMETRY, ESOPHAGUS

## 2018-07-08 MED ORDER — LIDOCAINE VISCOUS HCL 2 % MT SOLN
OROMUCOSAL | Status: AC
  Filled 2018-07-08: qty 15

## 2018-07-08 SURGICAL SUPPLY — 2 items
FACESHIELD LNG OPTICON STERILE (SAFETY) IMPLANT
GLOVE BIO SURGEON STRL SZ8 (GLOVE) ×6 IMPLANT

## 2018-07-08 NOTE — Therapy (Signed)
[place], [place], [address] Phone: [phone]   Fax:  [phone]  Physical Therapy Evaluation  Patient Details  Name: Eric Galvan MRN: [id number] Date of Birth: 16-Mar-1966 Referring Provider: Kathreen Cosier   Encounter Date: 07/08/2018  PT End of Session - 07/08/18 1608    Visit Number  1    Date for PT Re-Evaluation  09/07/18    PT Start Time  1525    PT Stop Time  1625    PT Time Calculation (min)  60 min    Activity Tolerance  Patient tolerated treatment well    Behavior During Therapy  [facility] for tasks assessed/performed       Past Medical History:  Diagnosis Date  . Allergy   . Diabetes mellitus   . Hyperlipidemia   . Sleep apnea     Past Surgical History:  Procedure Laterality Date  . cluster headaches      There were no vitals filed for this visit.   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  X-rays show mild scoliosis and a slight slippage in the lower back.  He had injection but had no relief, massage helped and Aleve helped    Limitations  Lifting;Sitting    Patient Stated Goals  have less pain    Currently in Pain?  Yes    Pain Score  5     Pain Location  Back    Pain Descriptors / Indicators  Aching;Sharp;Tightness    Pain Type  Acute pain    Pain Radiating Towards  reports some pain in the forearms at times    Pain Onset  More than a month ago    Aggravating Factors   head motions, sitting, lifting, at times wakes him up pain up to 10/10    Pain Relieving Factors  reaching up helps, aleve, rest pain at best a 3/10    Effect of Pain on Daily Activities  just hurts, limits his activity         [facility] PT Assessment - 07/08/18 0001      Assessment   Medical Diagnosis  thoracic pain    Referring Provider  Shmitz    Onset  Date/Surgical Date  06/07/18    Hand Dominance  Right      Precautions   Precautions  None      Balance Screen   Has the patient fallen in the past 6 months  No    Has the patient had a decrease in activity level because of a fear of falling?   No    Is the patient reluctant to leave their home because of a fear of falling?   No      Home Environment   Additional Comments  does some housework      Prior Function   Level of Independence  Independent    Vocation  Full time employment    Vocation Requirements  sitting at computer    Leisure  no exercise      Posture/Postural Control   Posture Comments  fwd head, rounded shoulders      ROM / Strength   AROM / PROM / Strength  AROM;Strength      AROM   Overall AROM Comments  Cervical ROM decreased  50% with pain in the C/T area,  shoulder ROM WNL's, lumbar ROM decreaesd 50%  with some pain      Strength   Overall Strength Comments  4+/5 with mild pain                Objective measurements completed on examination: See above findings.      [facility] Adult PT Treatment/Exercise - 07/08/18 0001      Modalities   Modalities  Moist Heat;Electrical Stimulation      Moist Heat Therapy   Number Minutes Moist Heat  15 Minutes    Moist Heat Location  Cervical      Electrical Stimulation   Electrical Stimulation Location  C/T area    Electrical Stimulation Action  IFC    Electrical Stimulation Parameters  supine    Electrical Stimulation Goals  Pain       Trigger Point Dry Needling - 07/08/18 1607    Consent Given?  Yes    Education Handout Provided  Yes    Muscles Treated Upper Body  Upper trapezius;Suboccipitals muscle group    Upper Trapezius Response  Palpable increased muscle length    SubOccipitals Response  Twitch response elicited           PT Education - 07/08/18 1654    Education Details  HEP for shoulder shrugs, scapular and cervical retraction    Person(s) Educated  Patient    Methods   Explanation;Demonstration;Handout    Comprehension  Verbalized understanding       PT Short Term Goals - 07/08/18 1653      PT SHORT TERM GOAL #1   Title  independent with initial HEP    Time  2    Period  Weeks    Status  New        PT Long Term Goals - 07/08/18 1655      PT LONG TERM GOAL #1   Title  report pain decreased 50%    Time  8    Period  Weeks    Status  New      PT LONG TERM GOAL #2   Title  increase cervical ROM 25%    Time  8    Period  Weeks    Status  New      PT LONG TERM GOAL #3   Title  decrease pain 50%    Time  8    Period  Weeks    Status  New      PT LONG TERM GOAL #4   Title  understand proper posture and body mechanics    Time  8    Period  Weeks    Status  New             Plan - 07/08/18 1608    Clinical Impression Statement  Patient reports that he has had upper back pain for 2 months, he is unsure of what the cause is, 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  X-rays show mild scoliosis.  He is very tender at C/T junction C7-T1, mms spasms in the cervical area and the upper traps.  Cervical ROM is decreased 50%,     Clinical Presentation  Stable    Clinical Decision Making  Low    Rehab Potential  Good    PT Frequency  2x / week    PT Duration  8 weeks    PT Treatment/Interventions  ADLs/Self Care Home Management;Cryotherapy;Electrical Stimulation;Moist Heat;Traction;Therapeutic exercise;Therapeutic activities;Patient/family  education;Manual techniques;Dry needling    PT Next Visit Plan  Start gym activities, could try STM and or traction    Consulted and Agree with Plan of Care  Patient       Patient will benefit from skilled therapeutic intervention in order to improve the following deficits and impairments:  Decreased range of motion, Increased muscle spasms, Pain, Improper body mechanics, Postural dysfunction  Visit Diagnosis: Cervicalgia - Plan: PT plan of care cert/re-cert  Pain in thoracic spine - Plan: PT plan of  care cert/re-cert  Cramp and spasm - Plan: PT plan of care cert/re-cert     Problem List Patient Active Problem List   Diagnosis Date Noted  . Back pain 06/05/2018  . Special screening for malignant neoplasms, colon 09/10/2013  . Dysphagia, unspecified(787.20) 07/09/2013  . Diabetes type 2, controlled ([place]) 02/17/2013  . Hyperlipidemia 02/17/2013  . Allergic rhinitis 02/17/2013    Sumner Boast., PT 07/08/2018, 5:00 PM  [place], [place], [address] Phone: [phone]   Fax:  [phone]  Name: ANTONY SIAN MRN: [id number] Date of Birth: 04-Mar-1966

## 2018-07-08 NOTE — Progress Notes (Signed)
Esophageal manometry performed per protocol.  Patient tolerated procedure without complications.  PH probe was placed per protocol at 40cm.  Patient tolerated procedure without complications.  Instructed patient on how to use recorder and provided instructions on when to return.  Patient to return on tomorrow after 11:14.  Dr. Michail Sermon to interpret study.

## 2018-07-08 NOTE — Patient Instructions (Signed)

## 2018-07-11 ENCOUNTER — Encounter (HOSPITAL_COMMUNITY): Payer: Self-pay | Admitting: Gastroenterology

## 2018-07-14 ENCOUNTER — Ambulatory Visit: Payer: 59 | Admitting: Physical Therapy

## 2018-07-14 ENCOUNTER — Encounter: Payer: Self-pay | Admitting: Physical Therapy

## 2018-07-14 DIAGNOSIS — K219 Gastro-esophageal reflux disease without esophagitis: Secondary | ICD-10-CM | POA: Diagnosis not present

## 2018-07-14 DIAGNOSIS — M542 Cervicalgia: Secondary | ICD-10-CM | POA: Diagnosis not present

## 2018-07-14 DIAGNOSIS — R252 Cramp and spasm: Secondary | ICD-10-CM | POA: Diagnosis not present

## 2018-07-14 DIAGNOSIS — M546 Pain in thoracic spine: Secondary | ICD-10-CM | POA: Diagnosis not present

## 2018-07-14 DIAGNOSIS — R1310 Dysphagia, unspecified: Secondary | ICD-10-CM | POA: Diagnosis not present

## 2018-07-14 NOTE — Therapy (Signed)
[place], [place], [address] Phone: [phone]   Fax:  [phone]  Physical Therapy Treatment  Patient Details  Name: Eric Galvan MRN: [id number] Date of Birth: Apr 29, 1966 Referring Provider: Kathreen Cosier   Encounter Date: 07/14/2018  PT End of Session - 07/14/18 1605    Visit Number  2    Date for PT Re-Evaluation  09/07/18    PT Start Time  1530    PT Stop Time  1630    PT Time Calculation (min)  60 min    Activity Tolerance  Patient tolerated treatment well    Behavior During Therapy  [facility] for tasks assessed/performed       Past Medical History:  Diagnosis Date  . Allergy   . Diabetes mellitus   . Hyperlipidemia   . Sleep apnea     Past Surgical History:  Procedure Laterality Date  . cluster headaches    . ESOPHAGEAL MANOMETRY N/A 07/08/2018   Procedure: ESOPHAGEAL MANOMETRY (EM);  Surgeon: Wilford Corner, MD;  Location: [facility];  Service: Endoscopy;  Laterality: N/A;  . [place] IMPEDANCE STUDY N/A 07/08/2018   Procedure: [place] IMPEDANCE STUDY;  Surgeon: Wilford Corner, MD;  Location: [facility];  Service: Endoscopy;  Laterality: N/A;    There were no vitals filed for this visit.  Subjective Assessment - 07/14/18 1529    Subjective  Patient reports that the last treatment helped a little bit, but it came back.  Has MRI on Thursday    Currently in Pain?  Yes    Pain Score  7     Pain Location  Neck                       [facility] Adult PT Treatment/Exercise - 07/14/18 0001      Exercises   Exercises  Neck      Neck Exercises: Machines for Strengthening   UBE (Upper Arm Bike)  level 4 x 4 minutes    Cybex Row  25# 2x10    Lat Pull  25# 2x10    Other Machines for Strengthening  5# shrugs with upper trap and levator stretches      Neck Exercises: Standing   Other Standing Exercises  W's 20 reps      Modalities   Modalities  Traction      Moist Heat  Therapy   Number Minutes Moist Heat  15 Minutes    Moist Heat Location  Cervical      Electrical Stimulation   Electrical Stimulation Location  more in the rhomboid to cervical area    Electrical Stimulation Action  IFC    Electrical Stimulation Parameters  supine    Electrical Stimulation Goals  Pain      Traction   Type of Traction  Cervical    Min (lbs)  20#    Rest Time  static    Time  10 minutes      Manual Therapy   Manual Therapy  Joint mobilization;Soft tissue mobilization    Joint Mobilization  to Thoracic area PA's and gentle rotations    Soft tissue mobilization  to the thoracic and upper trap area       Trigger Point Dry Needling - 07/14/18 1602    Consent Given?  Yes    Upper Trapezius Response  Twitch reponse elicited    SubOccipitals Response  Palpable increased muscle length  PT Short Term Goals - 07/08/18 1653      PT SHORT TERM GOAL #1   Title  independent with initial HEP    Time  2    Period  Weeks    Status  New        PT Long Term Goals - 07/08/18 1655      PT LONG TERM GOAL #1   Title  report pain decreased 50%    Time  8    Period  Weeks    Status  New      PT LONG TERM GOAL #2   Title  increase cervical ROM 25%    Time  8    Period  Weeks    Status  New      PT LONG TERM GOAL #3   Title  decrease pain 50%    Time  8    Period  Weeks    Status  New      PT LONG TERM GOAL #4   Title  understand proper posture and body mechanics    Time  8    Period  Weeks    Status  New            Plan - 07/14/18 1605    Clinical Impression Statement  Patient is very stiff in the thoracic area limited segmental ,mobility  in this area, he feels at times that this is where his pain is coming from.  His tissue is very dense and tight.  None of the exercises caused any worse pain.      PT Next Visit Plan  could try US/estim combo to get the spot he feels it starts from.  It is deep and hard to try to touch    Consulted  and Agree with Plan of Care  Patient       Patient will benefit from skilled therapeutic intervention in order to improve the following deficits and impairments:  Decreased range of motion, Increased muscle spasms, Pain, Improper body mechanics, Postural dysfunction  Visit Diagnosis: Cervicalgia  Pain in thoracic spine  Cramp and spasm     Problem List Patient Active Problem List   Diagnosis Date Noted  . Back pain 06/05/2018  . Special screening for malignant neoplasms, colon 09/10/2013  . Dysphagia, unspecified(787.20) 07/09/2013  . Diabetes type 2, controlled ([place]) 02/17/2013  . Hyperlipidemia 02/17/2013  . Allergic rhinitis 02/17/2013    Sumner Boast., PT 07/14/2018, 4:08 PM  [place], [place], [address] Phone: [phone]   Fax:  [phone]  Name: Eric Galvan MRN: [id number] Date of Birth: 06-09-1966

## 2018-07-16 ENCOUNTER — Telehealth: Payer: Self-pay | Admitting: Family Medicine

## 2018-07-16 ENCOUNTER — Ambulatory Visit
Admission: RE | Admit: 2018-07-16 | Discharge: 2018-07-16 | Disposition: A | Discharge status: Home / Self Care | Payer: 59 | Source: Ambulatory Visit | Attending: Family Medicine | Admitting: Family Medicine

## 2018-07-16 DIAGNOSIS — M542 Cervicalgia: Secondary | ICD-10-CM

## 2018-07-16 DIAGNOSIS — M4802 Spinal stenosis, cervical region: Secondary | ICD-10-CM | POA: Diagnosis not present

## 2018-07-16 DIAGNOSIS — M5020 Other cervical disc displacement, unspecified cervical region: Secondary | ICD-10-CM

## 2018-07-16 NOTE — Telephone Encounter (Signed)
Unable to leave VM for patient. If he calls back please have him speak with a nurse/CMA and inform that his MRI showed a Bulky cervical disc herniation. this will need to be evaluated by a neurosurgeon for the possible need of surgery. I will put this referral in urgently. The [facility] can report results to patient.   If any questions then please take the best time and phone number to call and I will try to call him back.   Rosemarie Ax, MD [facility] 07/16/2018, 2:24 PM

## 2018-07-17 ENCOUNTER — Encounter: Payer: 59 | Admitting: Physical Therapy

## 2018-07-20 ENCOUNTER — Encounter: Payer: Self-pay | Admitting: Physical Therapy

## 2018-07-20 ENCOUNTER — Ambulatory Visit: Payer: 59 | Admitting: Physical Therapy

## 2018-07-20 DIAGNOSIS — M546 Pain in thoracic spine: Secondary | ICD-10-CM

## 2018-07-20 DIAGNOSIS — M542 Cervicalgia: Secondary | ICD-10-CM | POA: Diagnosis not present

## 2018-07-20 DIAGNOSIS — R252 Cramp and spasm: Secondary | ICD-10-CM

## 2018-07-20 NOTE — Therapy (Signed)
[place], [place], [address] Phone: [phone]   Fax:  [phone]  Physical Therapy Treatment  Patient Details  Name: Eric Galvan MRN: [id number] Date of Birth: 1966-04-15 Referring Provider (PT): Shmitz   Encounter Date: 07/20/2018  PT End of Session - 07/20/18 1447    Visit Number  3    Date for PT Re-Evaluation  09/07/18    PT Start Time  1430    PT Stop Time  1505    PT Time Calculation (min)  35 min    Activity Tolerance  Patient tolerated treatment well    Behavior During Therapy  [facility] for tasks assessed/performed       Past Medical History:  Diagnosis Date  . Allergy   . Diabetes mellitus   . Hyperlipidemia   . Sleep apnea     Past Surgical History:  Procedure Laterality Date  . cluster headaches    . ESOPHAGEAL MANOMETRY N/A 07/08/2018   Procedure: ESOPHAGEAL MANOMETRY (EM);  Surgeon: Wilford Corner, MD;  Location: [facility];  Service: Endoscopy;  Laterality: N/A;  . [place] IMPEDANCE STUDY N/A 07/08/2018   Procedure: [place] IMPEDANCE STUDY;  Surgeon: Wilford Corner, MD;  Location: [facility];  Service: Endoscopy;  Laterality: N/A;    There were no vitals filed for this visit.  Subjective Assessment - 07/20/18 1432    Subjective  "I have been in pain"    Currently in Pain?  Yes    Pain Score  3     Pain Location  Neck                       [facility] Adult PT Treatment/Exercise - 07/20/18 0001      Moist Heat Therapy   Number Minutes Moist Heat  15 Minutes    Moist Heat Location  Cervical      Electrical Stimulation   Electrical Stimulation Location  more in the rhomboid to cervical area    Electrical Stimulation Action  IFC    Electrical Stimulation Parameters  supine    Electrical Stimulation Goals  Pain      Traction   Type of Traction  Cervical    Min (lbs)  20#    Rest Time  static    Time  10 minutes               PT Short  Term Goals - 07/08/18 1653      PT SHORT TERM GOAL #1   Title  independent with initial HEP    Time  2    Period  Weeks    Status  New        PT Long Term Goals - 07/08/18 1655      PT LONG TERM GOAL #1   Title  report pain decreased 50%    Time  8    Period  Weeks    Status  New      PT LONG TERM GOAL #2   Title  increase cervical ROM 25%    Time  8    Period  Weeks    Status  New      PT LONG TERM GOAL #3   Title  decrease pain 50%    Time  8    Period  Weeks    Status  New      PT LONG TERM GOAL #4   Title  understand proper  posture and body mechanics    Time  8    Period  Weeks    Status  New            Plan - 07/20/18 1448    Clinical Impression Statement  Pt reports that he has been in pain, but taking OTC med's to help. Went over the impression of his most recent MRI of his cervical spine. Pt was recommended a Neurosurgery consultation and he said that is happening this week. Treatment pt with mechanical traction and estim for pain until further instruction neurosurgeon.    Rehab Potential  Good    PT Treatment/Interventions  ADLs/Self Care Home Management;Cryotherapy;Electrical Stimulation;Moist Heat;Traction;Therapeutic exercise;Therapeutic activities;Patient/family education;Manual techniques;Dry needling    PT Next Visit Plan  neurosurgeon appointment Thursday        Patient will benefit from skilled therapeutic intervention in order to improve the following deficits and impairments:  Decreased range of motion, Increased muscle spasms, Pain, Improper body mechanics, Postural dysfunction  Visit Diagnosis: Cervicalgia  Pain in thoracic spine  Cramp and spasm     Problem List Patient Active Problem List   Diagnosis Date Noted  . Back pain 06/05/2018  . Special screening for malignant neoplasms, colon 09/10/2013  . Dysphagia, unspecified(787.20) 07/09/2013  . Diabetes type 2, controlled ([place]) 02/17/2013  . Hyperlipidemia 02/17/2013  .  Allergic rhinitis 02/17/2013    Scot Jun, PTA 07/20/2018, 3:04 PM  [place], [place], [address] Phone: [phone]   Fax:  [phone]  Name: Eric Galvan MRN: [id number] Date of Birth: September 14, 1966

## 2018-07-20 NOTE — Telephone Encounter (Signed)
Patient notified of report results- he has been notified by neurosurgeon.

## 2018-07-23 DIAGNOSIS — G959 Disease of spinal cord, unspecified: Secondary | ICD-10-CM | POA: Diagnosis not present

## 2018-07-23 DIAGNOSIS — K118 Other diseases of salivary glands: Secondary | ICD-10-CM | POA: Diagnosis not present

## 2018-07-24 ENCOUNTER — Encounter: Payer: 59 | Admitting: Physical Therapy

## 2018-07-28 DIAGNOSIS — M542 Cervicalgia: Secondary | ICD-10-CM | POA: Diagnosis not present

## 2018-07-30 ENCOUNTER — Other Ambulatory Visit (HOSPITAL_COMMUNITY)
Admission: RE | Admit: 2018-07-30 | Discharge: 2018-07-30 | Disposition: A | Discharge status: Home / Self Care | Payer: 59 | Source: Ambulatory Visit | Attending: Otolaryngology | Admitting: Otolaryngology

## 2018-07-30 DIAGNOSIS — D37030 Neoplasm of uncertain behavior of the parotid salivary glands: Secondary | ICD-10-CM | POA: Diagnosis not present

## 2018-07-30 DIAGNOSIS — G4733 Obstructive sleep apnea (adult) (pediatric): Secondary | ICD-10-CM | POA: Diagnosis not present

## 2018-07-31 ENCOUNTER — Other Ambulatory Visit: Payer: Self-pay | Admitting: Otolaryngology

## 2018-08-06 ENCOUNTER — Other Ambulatory Visit: Payer: Self-pay | Admitting: Otolaryngology

## 2018-08-06 DIAGNOSIS — R221 Localized swelling, mass and lump, neck: Secondary | ICD-10-CM

## 2018-08-11 ENCOUNTER — Ambulatory Visit
Admission: RE | Admit: 2018-08-11 | Discharge: 2018-08-11 | Disposition: A | Discharge status: Home / Self Care | Payer: 59 | Source: Ambulatory Visit | Attending: Otolaryngology | Admitting: Otolaryngology

## 2018-08-11 DIAGNOSIS — R221 Localized swelling, mass and lump, neck: Secondary | ICD-10-CM

## 2018-08-11 MED ORDER — IOPAMIDOL (ISOVUE-300) INJECTION 61%
75.0000 mL | Freq: Once | INTRAVENOUS | Status: AC | PRN
  Administered 2018-08-11: 75 mL via INTRAVENOUS

## 2018-08-19 ENCOUNTER — Encounter (INDEPENDENT_AMBULATORY_CARE_PROVIDER_SITE_OTHER): Payer: Self-pay

## 2018-08-19 ENCOUNTER — Encounter: Payer: Self-pay | Admitting: Interventional Cardiology

## 2018-08-19 ENCOUNTER — Ambulatory Visit: Payer: 59 | Admitting: Interventional Cardiology

## 2018-08-19 VITALS — BP 118/64 | HR 88 | Ht 73.0 in | Wt 284.8 lb

## 2018-08-19 DIAGNOSIS — I2510 Atherosclerotic heart disease of native coronary artery without angina pectoris: Secondary | ICD-10-CM | POA: Diagnosis not present

## 2018-08-19 DIAGNOSIS — I2584 Coronary atherosclerosis due to calcified coronary lesion: Secondary | ICD-10-CM | POA: Diagnosis not present

## 2018-08-19 DIAGNOSIS — E782 Mixed hyperlipidemia: Secondary | ICD-10-CM

## 2018-08-19 DIAGNOSIS — E118 Type 2 diabetes mellitus with unspecified complications: Secondary | ICD-10-CM | POA: Diagnosis not present

## 2018-08-19 DIAGNOSIS — R079 Chest pain, unspecified: Secondary | ICD-10-CM

## 2018-08-19 NOTE — Progress Notes (Signed)
Cardiology Office Note:    Date:  08/19/2018   ID:  Eric Galvan, DOB Jul 13, 1966, MRN [id number]  PCP:  Benito Mccreedy, MD  Cardiologist:  Sinclair Grooms, MD   Referring MD: Benito Mccreedy, MD   Chief Complaint  Patient presents with  . Chest Pain    History of Present Illness:    Eric Galvan is a 52 y.o. male with a hx of chest pain who is referred by Dr. Iona Beard Osei-Bonsu and Dr. Wilford Corner for cardiology consultation.  Eric Galvan has esophageal disease with reflux.  Therapy has led to relief of symptoms.  In the interval after onset of chest discomfort he has had cardiac evaluation over the last 2 years.  Initially he had myocardial perfusion imaging performed in 2017 that was low risk.  Subsequently he underwent coronary CT angiography in September 2019.  Coronary CT angiography demonstrated nonobstructive disease with no high-grade obstruction.  Coronary calcium score was less than 100.  Cardiac risk factors include type 2 diabetes, hyperlipidemia, obesity, and obstructive sleep apnea.  Past Medical History:  Diagnosis Date  . Allergy   . Diabetes mellitus   . Hyperlipidemia   . Sleep apnea     Past Surgical History:  Procedure Laterality Date  . cluster headaches    . ESOPHAGEAL MANOMETRY N/A 07/08/2018   Procedure: ESOPHAGEAL MANOMETRY (EM);  Surgeon: Wilford Corner, MD;  Location: [facility];  Service: Endoscopy;  Laterality: N/A;  . [place] IMPEDANCE STUDY N/A 07/08/2018   Procedure: [place] IMPEDANCE STUDY;  Surgeon: Wilford Corner, MD;  Location: [facility];  Service: Endoscopy;  Laterality: N/A;    Current Medications: Current Meds  Medication Sig  . INVOKANA 300 MG TABS tablet Take 300 mg by mouth daily.  . sitaGLIPtan-metformin (JANUMET) 50-1000 MG per tablet Take 1 tablet by mouth 2 (two) times daily with a meal.      Allergies:   Other and Penicillins   Social History   Socioeconomic History  . Marital status: Married    Spouse  name: Not on file  . Number of children: 2  . Years of education: Not on file  . Highest education level: Not on file  Occupational History  . Occupation: Engineer, maintenance    Comment: [employer]  Social Needs  . Financial resource strain: Not on file  . Food insecurity:    Worry: Not on file    Inability: Not on file  . Transportation needs:    Medical: Not on file    Non-medical: Not on file  Tobacco Use  . Smoking status: Former Smoker    Last attempt to quit: 03/23/2002    Years since quitting: 16.4  . Smokeless tobacco: Never Used  Substance and Sexual Activity  . Alcohol use: Yes    Alcohol/week: 2.0 standard drinks    Types: 2 drink(s) per week    Comment: mixed drinks  . Drug use: No  . Sexual activity: Not on file  Lifestyle  . Physical activity:    Days per week: Not on file    Minutes per session: Not on file  . Stress: Not on file  Relationships  . Social connections:    Talks on phone: Not on file    Gets together: Not on file    Attends religious service: Not on file    Active member of club or organization: Not on file    Attends meetings of clubs or organizations: Not on file    Relationship  status: Not on file  Other Topics Concern  . Not on file  Social History Narrative   Lives with his wife and one of their two children.     Family History: The patient's family history includes Alzheimer's disease in his mother; Cancer in his other; Diabetes in his father and mother; Hypertension in his other.  ROS:   Please see the history of present illness.    Right peri-parotid lipoma, cervical disc disease that will require surgery (Dr. Rolena Infante).  Chronic back and muscle pain related to cervical and lumbar disc disease.  All other systems reviewed and are negative.  EKGs/Labs/Other Studies Reviewed:    The following studies were reviewed today:  Coronary CT angiography with morphology July 07, 2018: IMPRESSION: 1. Coronary calcium score of 12. This  was 35 percentile for age and sex matched control.  2. Normal coronary origin with right dominance.  3. Limited study quality sec to patients weight. However, there is only minimal plaque in the proximal LAD.  Exercise nuclear stress test January 2017 Study Highlights    The left ventricular ejection fraction is normal (55-65%).  Nuclear stress EF: 57%.  Upsloping ST segment depression ST segment depression of 1 mm was noted during stress.  No T wave inversion was noted during stress.  This is a low risk study.   No significant reversible ischemia. LVEF 57% with normal wall motion. This is a low risk study.     EKG:  EKG is  ordered today.  The ekg ordered today demonstrates normal sinus rhythm with normal EKG appearance and no change compared to prior  Recent Labs: 06/29/2018: BUN 11; Creatinine, Ser 0.84; Potassium 4.4; Sodium 139  Recent Lipid Panel No results found for: CHOL, TRIG, HDL, CHOLHDL, VLDL, LDLCALC, LDLDIRECT  Physical Exam:    VS:  BP 118/64   Pulse 88   Ht 6\' 1"  (1.854 m)   Wt 284 lb 12.8 oz (129.2 kg)   BMI 37.57 kg/m     Wt Readings from Last 3 Encounters:  08/19/18 284 lb 12.8 oz (129.2 kg)  07/08/18 274 lb 0.5 oz (124.3 kg)  06/04/18 274 lb (124.3 kg)     GEN: Obese.  Well nourished, well developed in no acute distress HEENT: Normal NECK: No JVD.Marland Kitchen  Soft lipoma 1/2 to 2 cm x 2 cm right mandibular and preauricular area. LYMPHATICS: No lymphadenopathy CARDIAC: RRR, no murmur, no gallop, no edema. VASCULAR: 2+ bilateral radial and carotid pulses.  No bruits. RESPIRATORY:  Clear to auscultation without rales, wheezing or rhonchi  ABDOMEN: Soft, non-tender, non-distended, No pulsatile mass, MUSCULOSKELETAL: No deformity  SKIN: Warm and dry NEUROLOGIC:  Alert and oriented x 3 PSYCHIATRIC:  Normal affect   ASSESSMENT:    1. Type 2 diabetes mellitus with complication, without long-term current use of insulin (HCC)   2. Chest pain in adult     3. Mixed hyperlipidemia   4. Coronary artery calcification    PLAN:    In order of problems listed above:  1. The coronary CT angiogram in conjunction with prior stress nuclear study demonstrates that the patient has no obstructive coronary disease.  He does have minimal coronary plaque noted on CT Angio.  We concentrated on primary/secondary prevention of acute ischemic events: LDL target less than 7; LDL cholesterol less than 70 (probably needs to be on statin if not at that level); blood pressure 130/80 mmHg or less (aerobic activity and salt restriction and diet); 150 minutes of moderate aerobic  activity per week; continue therapy of obstructive sleep apnea.  No further cardiac evaluation is needed.  He is cleared for his upcoming surgery from a cardiovascular standpoint.  I will be happy to see him again in the future if any symptomatic cardiovascular concerns arise.  Greater than 50% of the time during this office visit was spent in education, counseling, and coordination of care related to underlying disease process and testing as outlined.    Medication Adjustments/Labs and Tests Ordered: Current medicines are reviewed at length with the patient today.  Concerns regarding medicines are outlined above.  No orders of the defined types were placed in this encounter.  No orders of the defined types were placed in this encounter.   Patient Instructions  Medication Instructions:  Your physician recommends that you continue on your current medications as directed. Please refer to the Current Medication list given to you today.  If you need a refill on your cardiac medications before your next appointment, please call your pharmacy.   Lab work: none If you have labs (blood work) drawn today and your tests are completely normal, you will receive your results only by: Marland Kitchen MyChart Message (if you have MyChart) OR . A paper copy in the mail If you have any lab test that is abnormal or we  need to change your treatment, we will call you to review the results.  Testing/Procedures: none  Follow-Up: Your physician recommends that you schedule a follow-up appointment as needed.      Any Other Special Instructions Will Be Listed Below (If Applicable).  Dr. Tamala Julian recommends the following:  HgBA1c less than 7 LDL less than 70 Blood pressure less than or equal to 130/80 150 minutes (or greater) of aerobic activity every week.       Signed, Sinclair Grooms, MD  08/19/2018 3:49 PM    [place]

## 2018-08-19 NOTE — Patient Instructions (Signed)
Medication Instructions:  Your physician recommends that you continue on your current medications as directed. Please refer to the Current Medication list given to you today.  If you need a refill on your cardiac medications before your next appointment, please call your pharmacy.   Lab work: none If you have labs (blood work) drawn today and your tests are completely normal, you will receive your results only by: Marland Kitchen MyChart Message (if you have MyChart) OR . A paper copy in the mail If you have any lab test that is abnormal or we need to change your treatment, we will call you to review the results.  Testing/Procedures: none  Follow-Up: Your physician recommends that you schedule a follow-up appointment as needed.      Any Other Special Instructions Will Be Listed Below (If Applicable).  Dr. Tamala Julian recommends the following:  HgBA1c less than 7 LDL less than 70 Blood pressure less than or equal to 130/80 150 minutes (or greater) of aerobic activity every week.

## 2018-08-20 DIAGNOSIS — G4730 Sleep apnea, unspecified: Secondary | ICD-10-CM | POA: Diagnosis not present

## 2018-08-25 DIAGNOSIS — G4730 Sleep apnea, unspecified: Secondary | ICD-10-CM | POA: Diagnosis not present

## 2018-09-03 DIAGNOSIS — M79675 Pain in left toe(s): Secondary | ICD-10-CM | POA: Diagnosis not present

## 2018-09-03 DIAGNOSIS — M21612 Bunion of left foot: Secondary | ICD-10-CM | POA: Diagnosis not present

## 2018-09-03 DIAGNOSIS — M21611 Bunion of right foot: Secondary | ICD-10-CM | POA: Diagnosis not present

## 2018-09-03 DIAGNOSIS — M79674 Pain in right toe(s): Secondary | ICD-10-CM | POA: Diagnosis not present

## 2018-09-03 DIAGNOSIS — M792 Neuralgia and neuritis, unspecified: Secondary | ICD-10-CM | POA: Diagnosis not present

## 2018-09-15 ENCOUNTER — Inpatient Hospital Stay (HOSPITAL_COMMUNITY): Admission: RE | Admit: 2018-09-15 | Payer: 59 | Source: Ambulatory Visit

## 2018-09-23 ENCOUNTER — Ambulatory Visit: Admission: RE | Admit: 2018-09-23 | Payer: 59 | Source: Ambulatory Visit | Admitting: Orthopedic Surgery

## 2018-09-23 ENCOUNTER — Encounter: Admission: RE | Payer: Self-pay | Source: Ambulatory Visit

## 2018-09-23 SURGERY — ANTERIOR CERVICAL DECOMPRESSION/DISCECTOMY FUSION 2 LEVELS
Anesthesia: General

## 2018-10-29 DIAGNOSIS — M542 Cervicalgia: Secondary | ICD-10-CM | POA: Diagnosis not present

## 2018-11-01 DIAGNOSIS — M7918 Myalgia, other site: Secondary | ICD-10-CM | POA: Diagnosis not present

## 2018-11-01 DIAGNOSIS — W07XXXA Fall from chair, initial encounter: Secondary | ICD-10-CM | POA: Diagnosis not present

## 2018-11-01 DIAGNOSIS — M549 Dorsalgia, unspecified: Secondary | ICD-10-CM | POA: Diagnosis not present

## 2018-11-01 DIAGNOSIS — M542 Cervicalgia: Secondary | ICD-10-CM | POA: Diagnosis not present

## 2018-11-05 DIAGNOSIS — M542 Cervicalgia: Secondary | ICD-10-CM | POA: Diagnosis not present

## 2018-11-05 IMAGING — MR MR CERVICAL SPINE W/O CM
4 of 5 series · 26 of 48 positions shown · non-contrast
Comparison: Cervical spine radiographs 06/18/2018.

ADDENDUM:
Study discussed by telephone with Dr. POLIN BILLIOT on 07/16/2018 at
3346 hours.

Portions of this study were reviewed in person with ENT Dr. Trigo
Khalida on 07/31/2018 at 6166 hours.
Partially visible on only the axial images there is an oval mass in
the anterior right parotid space (series 12, image 1), which seems
to follow the signal of subcutaneous fat on these T2 and GRE images.
Dr. Khalida notes the patient also has a palpable subcutaneous lipoma
in the right posterior neck, which is probably visible on series 12,
image 6.
We discussed follow-up Neck CT to further characterize the right
parotid lesion.
CLINICAL DATA: 52-year-old male with severe neck and back pain for
3 months. Pain in weakness in the left arm. No known injury.
EXAM:
MRI CERVICAL SPINE WITHOUT CONTRAST
TECHNIQUE: Multiplanar, multisequence MR imaging of the cervical spine was
performed. No intravenous contrast was administered.

[Series 9: STIR · sagittal · 3.0mm · 0.33mm/px · 5 of 15 slices shown]
[im 1/15]
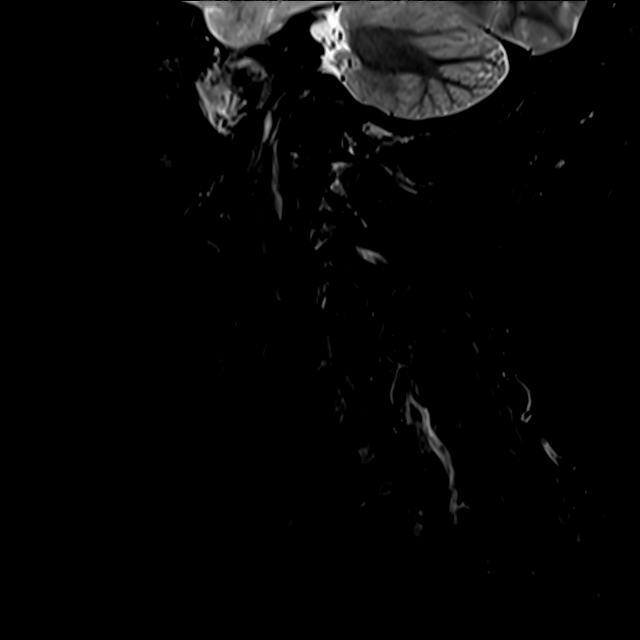
[im 3/15]
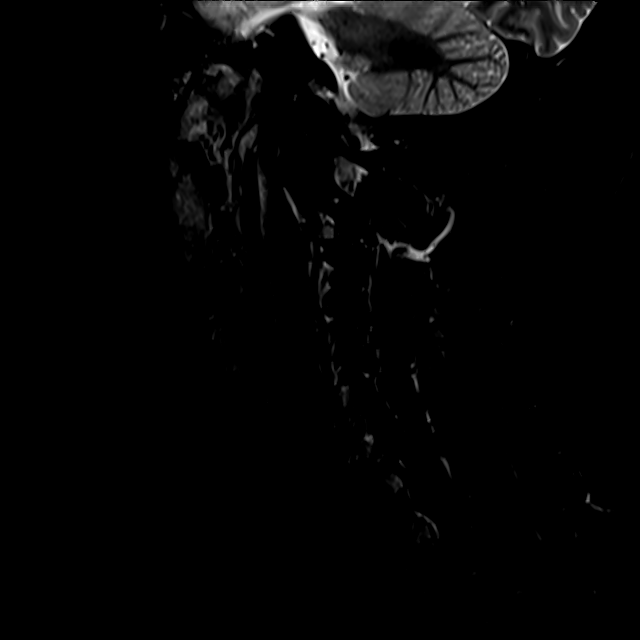
[im 6/15]
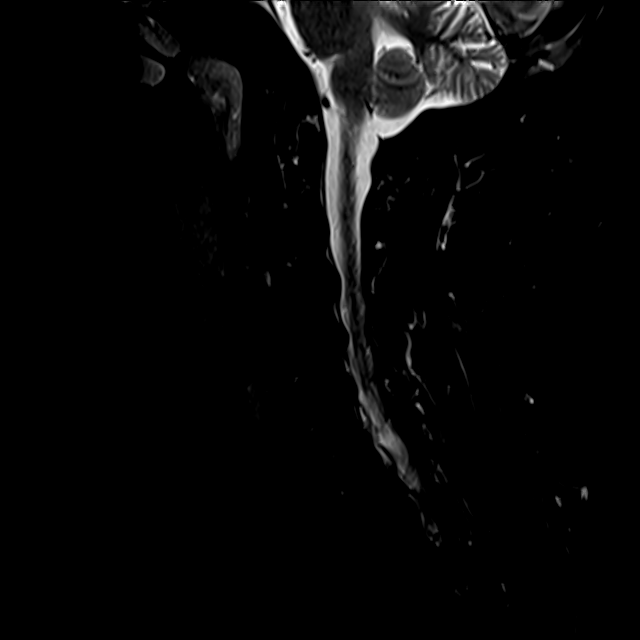
[im 9/15]
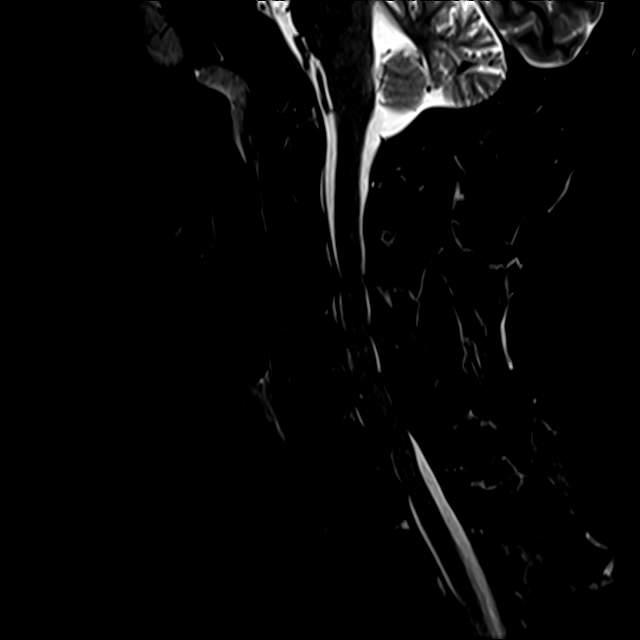
[im 15/15]
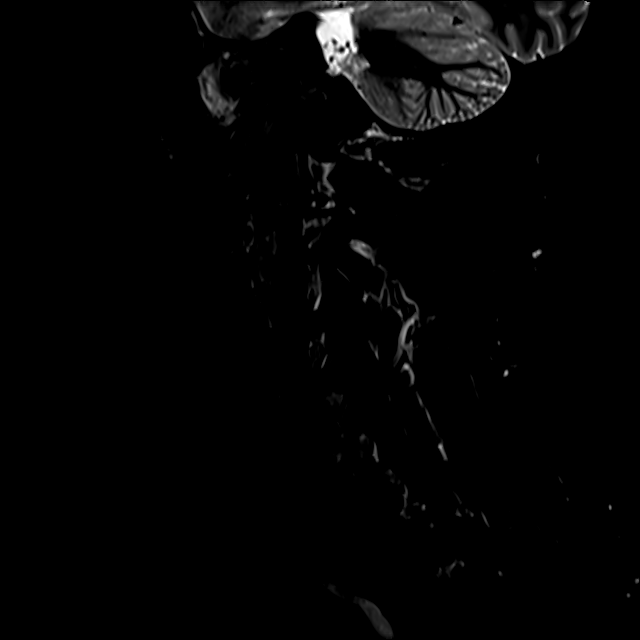

[Series 10: T1 · sagittal · 3.0mm · 0.66mm/px · 6 of 15 slices shown]
[im 1/15]
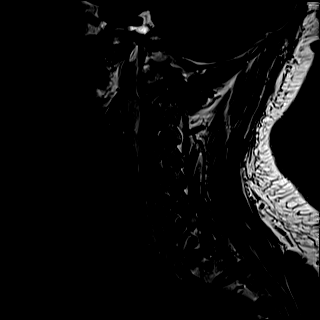
[im 3/15]
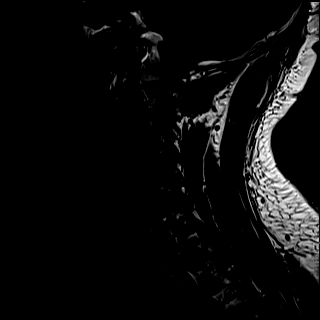
[im 6/15]
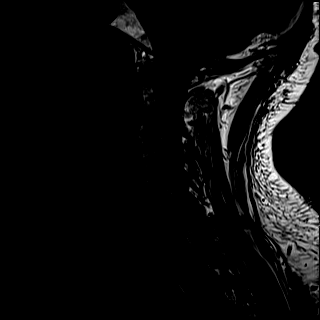
[im 9/15]
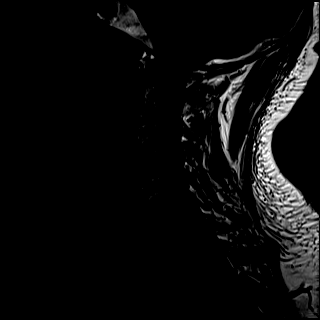
[im 12/15]
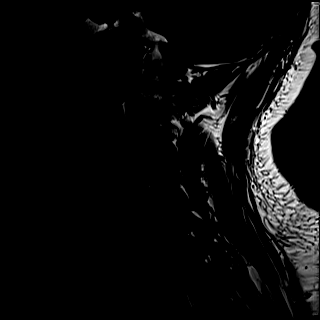
[im 15/15]
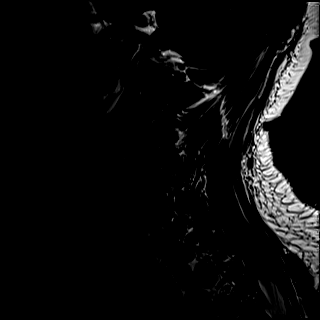

[Series 11: T2 · sagittal · 3.0mm · 0.55mm/px · 6 of 15 slices shown (1 of 2)]
[im 1/15]
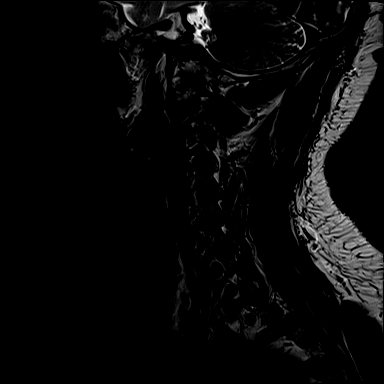
[im 3/15]
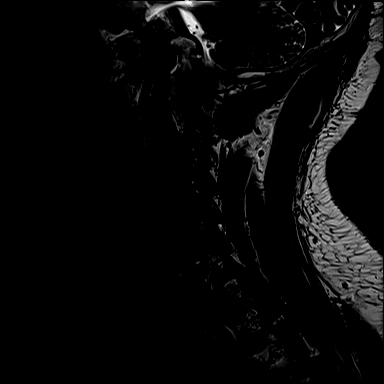
[im 6/15]
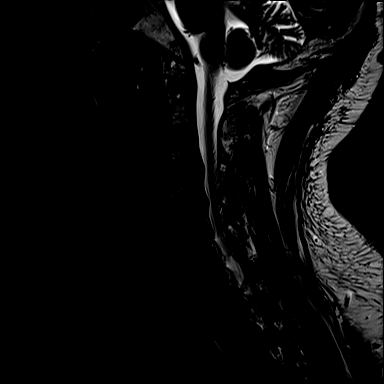
[im 9/15]
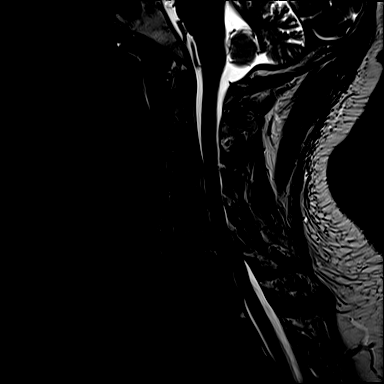
[im 12/15]
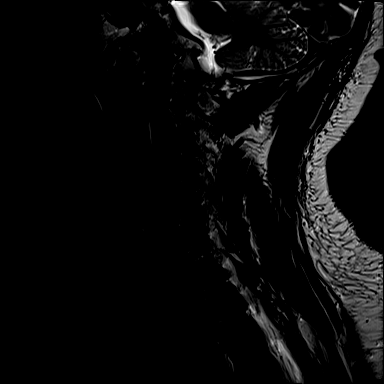
[im 15/15]
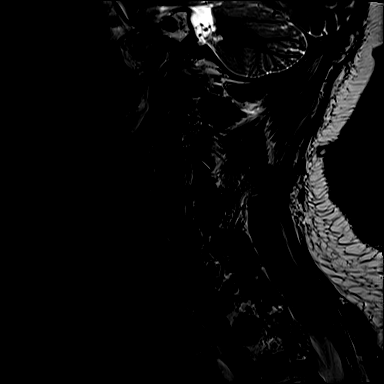

[Series 12: T2 · axial · 3.0mm · 0.50mm/px · z∈[-51,+63]mm · 9 of 36 slices shown (2 of 2)]
[im 1/36]
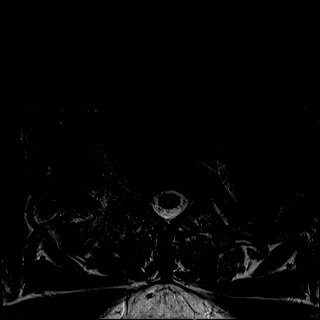
[im 6/36]
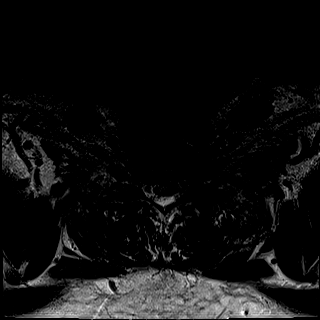
[im 11/36]
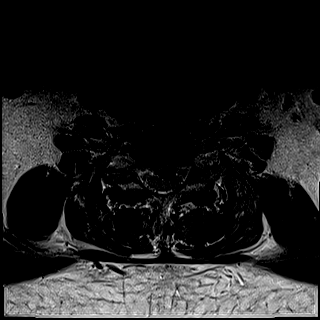
[im 16/36]
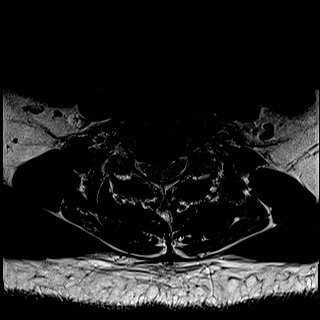
[im 18/36]
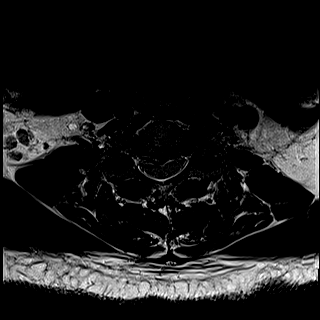
[im 21/36]
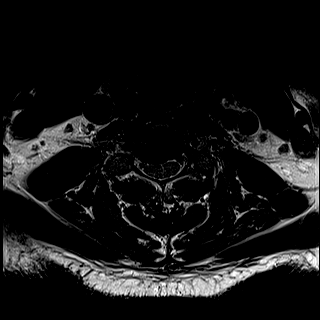
[im 26/36]
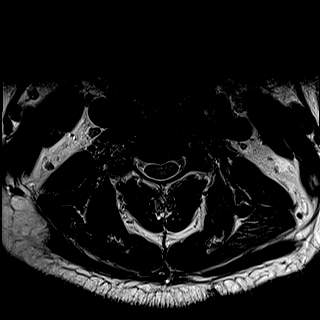
[im 31/36]
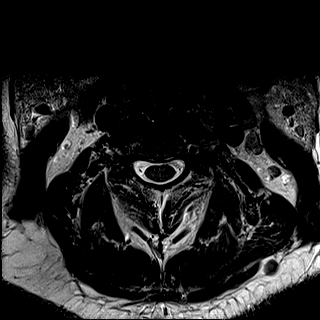
[im 36/36]
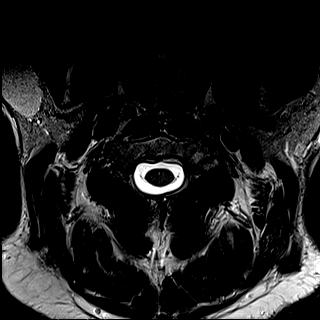

[26 of 48 positions shown; findings below may reference images not displayed]

FINDINGS: Alignment: Mild straightening of cervical lordosis appears stable
since [REDACTED]. No spondylolisthesis.

Vertebrae: No marrow edema or evidence of acute osseous abnormality.
Visualized bone marrow signal is within normal limits.

Cord: No cord signal abnormality identified despite a bulky disc
extrusion at the C6 level, detailed below.

Posterior Fossa, vertebral arteries, paraspinal tissues:
Cervicomedullary junction is within normal limits. Negative visible
brain parenchyma. Preserved major vascular flow voids in the neck.
Negative neck soft tissues and visible lung apices.

Disc levels:

C2-C3: Mild foraminal disc osteophyte complex and subtle right
paracentral disc protrusion. Mild facet hypertrophy. No significant
stenosis.

C3-C4: Mild circumferential disc bulge and endplate spurring. Mild
spinal stenosis with no cord mass effect. Mild to moderate left C4
foraminal stenosis primarily due to disc osteophyte complex.

C4-C5: Disc space loss with right eccentric circumferential disc
bulge and endplate spurring. Broad-based right paracentral component
(series 12, image 16). Spinal stenosis with mild spinal cord mass
effect. Mild left and moderate to severe right C5 foraminal
stenosis.

C5-C6: Disc space loss. Circumferential disc bulge and endplate
spurring with superimposed suspected large caudal disc extrusion
from the right of midline (series 12, images 20-25). Bulky
sequestered disc material posterior to the C6 vertebral body (series
11, image 9) estimated at 6 x 10 x 17 millimeters (AP by transverse
by CC) and contiguous with disc disease at the C6-C7 level.

Subsequent moderate to severe spinal stenosis maximal at the lower
C6 level. Associated spinal cord mass effect, worst at the left hemi
cord (series 13, image 25). Mild spinal stenosis with more right
hemi cord mass effect at the disc space level however. And mild
bilateral C6 foraminal stenosis.

C6-C7: Contiguous extruded disc from the level above with
superimposed left paracentral disc protrusion at this level. Mild
endplate spurring. Moderate to severe spinal stenosis with mostly
left hemi cord mass effect as stated above, maximal at or just above
this disc space level. No significant C7 foraminal stenosis.

C7-T1: Mild circumferential disc bulge with superimposed broad-based
right paracentral disc protrusion with endplate spurring (series 12,
image 31). Effaced ventral CSF space but no significant spinal
stenosis. Mild to moderate bilateral C8 foraminal stenosis.

No upper thoracic spinal or foraminal stenosis.
IMPRESSION: 1. Bulky cervical disc herniation and sequestered disc fragment at
the C6 vertebral level. I favor this disc material originated from
the C5-C6 level and was caudally extruded.
Subsequently, 2 disc spaces are most affected: C5-C6 where there is
mild spinal stenosis and right hemi cord mass effect, and C6-C7
where there is moderate to severe spinal stenosis with mostly left
hemi cord mass effect.
No associated cord signal abnormality. Mild bilateral foraminal
stenosis at C5-C6.
Recommend Neurosurgery consultation
2. Other cervical spine disc degeneration. There is mild spinal
stenosis at C3-C4 and C4-C5, with up to mild cord mass effect at the
latter.
3. There is also moderate left C4, moderate to severe right C5, and
mild to moderate bilateral C8 neural foraminal stenosis.

## 2018-11-06 ENCOUNTER — Ambulatory Visit (HOSPITAL_COMMUNITY): Payer: Self-pay | Admitting: Orthopedic Surgery

## 2018-11-10 DIAGNOSIS — I10 Essential (primary) hypertension: Secondary | ICD-10-CM | POA: Diagnosis not present

## 2018-11-10 DIAGNOSIS — E785 Hyperlipidemia, unspecified: Secondary | ICD-10-CM | POA: Diagnosis not present

## 2018-11-10 DIAGNOSIS — E119 Type 2 diabetes mellitus without complications: Secondary | ICD-10-CM | POA: Diagnosis not present

## 2018-11-10 DIAGNOSIS — E1140 Type 2 diabetes mellitus with diabetic neuropathy, unspecified: Secondary | ICD-10-CM | POA: Diagnosis not present

## 2018-11-12 DIAGNOSIS — M542 Cervicalgia: Secondary | ICD-10-CM | POA: Diagnosis not present

## 2018-11-13 ENCOUNTER — Ambulatory Visit (HOSPITAL_COMMUNITY): Payer: Self-pay | Admitting: Orthopedic Surgery

## 2018-11-17 DIAGNOSIS — Z1329 Encounter for screening for other suspected endocrine disorder: Secondary | ICD-10-CM | POA: Diagnosis not present

## 2018-11-17 DIAGNOSIS — Z125 Encounter for screening for malignant neoplasm of prostate: Secondary | ICD-10-CM | POA: Diagnosis not present

## 2018-11-17 DIAGNOSIS — Z131 Encounter for screening for diabetes mellitus: Secondary | ICD-10-CM | POA: Diagnosis not present

## 2018-11-17 DIAGNOSIS — H539 Unspecified visual disturbance: Secondary | ICD-10-CM | POA: Diagnosis not present

## 2018-11-17 DIAGNOSIS — E1140 Type 2 diabetes mellitus with diabetic neuropathy, unspecified: Secondary | ICD-10-CM | POA: Diagnosis not present

## 2018-11-17 DIAGNOSIS — Z136 Encounter for screening for cardiovascular disorders: Secondary | ICD-10-CM | POA: Diagnosis not present

## 2018-11-17 DIAGNOSIS — E785 Hyperlipidemia, unspecified: Secondary | ICD-10-CM | POA: Diagnosis not present

## 2018-11-17 DIAGNOSIS — Z01118 Encounter for examination of ears and hearing with other abnormal findings: Secondary | ICD-10-CM | POA: Diagnosis not present

## 2018-11-17 DIAGNOSIS — E119 Type 2 diabetes mellitus without complications: Secondary | ICD-10-CM | POA: Diagnosis not present

## 2018-11-17 DIAGNOSIS — I10 Essential (primary) hypertension: Secondary | ICD-10-CM | POA: Diagnosis not present

## 2018-11-17 DIAGNOSIS — Z0001 Encounter for general adult medical examination with abnormal findings: Secondary | ICD-10-CM | POA: Diagnosis not present

## 2018-11-23 DIAGNOSIS — E782 Mixed hyperlipidemia: Secondary | ICD-10-CM | POA: Diagnosis not present

## 2018-11-23 DIAGNOSIS — I10 Essential (primary) hypertension: Secondary | ICD-10-CM | POA: Diagnosis not present

## 2018-11-23 DIAGNOSIS — E119 Type 2 diabetes mellitus without complications: Secondary | ICD-10-CM | POA: Diagnosis not present

## 2018-11-27 NOTE — Pre-Procedure Instructions (Addendum)
Eric Galvan  11/27/2018      [id number] [address], [place] [address] [place] [address] Phone: [phone] Fax: [phone]    Your procedure is scheduled on Wednesday, February 19th.  Report to [facility] Admitting at 6:30 A.M.  Call this number if you have problems the morning of surgery:  [phone]   Remember:  Do not eat or drink after midnight.    Take NO medicines the day of surgery.   7 days prior to surgery STOP taking any Aspirin (unless otherwise instructed by your surgeon), Aleve, Naproxen, Ibuprofen, Motrin, Advil, Goody's, BC's, all herbal medications, fish oil, and all vitamins.   WHAT DO I DO ABOUT MY DIABETES MEDICATION?   Do Not take your [facility] the day before surgery.   . Do not take oral diabetes medicines (pills) the morning of surgery. -INVOKANA and sitaGLIPtan-metformin (JANUMET)    How to Manage Your Diabetes Before and After Surgery  Why is it important to control my blood sugar before and after surgery? . Improving blood sugar levels before and after surgery helps healing and can limit problems. . A way of improving blood sugar control is eating a healthy diet by: o  Eating less sugar and carbohydrates o  Increasing activity/exercise o  Talking with your doctor about reaching your blood sugar goals . High blood sugars (greater than 180 mg/dL) can raise your risk of infections and slow your recovery, so you will need to focus on controlling your diabetes during the weeks before surgery. . Make sure that the doctor who takes care of your diabetes knows about your planned surgery including the date and location.  How do I manage my blood sugar before surgery? . Check your blood sugar at least 4 times a day, starting 2 days before surgery, to make sure that the level is not too high or low. o Check your blood sugar the morning of  your surgery when you wake up and every 2 hours until you get to the Short Stay unit. . If your blood sugar is less than 70 mg/dL, you will need to treat for low blood sugar: o Do not take insulin. o Treat a low blood sugar (less than 70 mg/dL) with  cup of clear juice (cranberry or apple), 4 glucose tablets, OR glucose gel. o Recheck blood sugar in 15 minutes after treatment (to make sure it is greater than 70 mg/dL). If your blood sugar is not greater than 70 mg/dL on recheck, call [phone] for further instructions. . Report your blood sugar to the short stay nurse when you get to Short Stay.  . If you are admitted to the hospital after surgery: o Your blood sugar will be checked by the staff and you will probably be given insulin after surgery (instead of oral diabetes medicines) to make sure you have good blood sugar levels. o The goal for blood sugar control after surgery is 80-180 mg/dL.     Do not wear jewelry.  Do not wear lotions, powders, or colognes, or deodorant.  Do not shave 48 hours prior to surgery.  Men may shave face and neck.  Do not bring valuables to the hospital.  [facility] is not responsible for any belongings or valuables.  Contacts, dentures or bridgework may not be worn into surgery.  Leave your suitcase in the car.  After surgery it may be brought  to your room.  For patients admitted to the hospital, discharge time will be determined by your treatment team.  Patients discharged the day of surgery will not be allowed to drive home.   Special instructions:   [place]- Preparing For Surgery  Before surgery, you can play an important role. Because skin is not sterile, your skin needs to be as free of germs as possible. You can reduce the number of germs on your skin by washing with CHG (chlorahexidine gluconate) Soap before surgery.  CHG is an antiseptic cleaner which kills germs and bonds with the skin to continue killing germs even after washing.     Oral Hygiene is also important to reduce your risk of infection.  Remember - BRUSH YOUR TEETH THE MORNING OF SURGERY WITH YOUR REGULAR TOOTHPASTE  Please do not use if you have an allergy to CHG or antibacterial soaps. If your skin becomes reddened/irritated stop using the CHG.  Do not shave (including legs and underarms) for at least 48 hours prior to first CHG shower. It is OK to shave your face.  Please follow these instructions carefully.   1. Shower the NIGHT BEFORE SURGERY and the MORNING OF SURGERY with CHG.   2. If you chose to wash your hair, wash your hair first as usual with your normal shampoo.  3. After you shampoo, rinse your hair and body thoroughly to remove the shampoo.  4. Use CHG as you would any other liquid soap. You can apply CHG directly to the skin and wash gently with a scrungie or a clean washcloth.   5. Apply the CHG Soap to your body ONLY FROM THE NECK DOWN.  Do not use on open wounds or open sores. Avoid contact with your eyes, ears, mouth and genitals (private parts). Wash Face and genitals (private parts)  with your normal soap.  6. Wash thoroughly, paying special attention to the area where your surgery will be performed.  7. Thoroughly rinse your body with warm water from the neck down.  8. DO NOT shower/wash with your normal soap after using and rinsing off the CHG Soap.  9. Pat yourself dry with a CLEAN TOWEL.  10. Wear CLEAN PAJAMAS to bed the night before surgery, wear comfortable clothes the morning of surgery  11. Place CLEAN SHEETS on your bed the night of your first shower and DO NOT SLEEP WITH PETS.    Day of Surgery:  Do not apply any deodorants/lotions.  Please wear clean clothes to the hospital/surgery center.   Remember to brush your teeth WITH YOUR REGULAR TOOTHPASTE.   Please read over the following fact sheets that you were given.

## 2018-11-30 ENCOUNTER — Inpatient Hospital Stay (HOSPITAL_COMMUNITY)
Admission: RE | Admit: 2018-11-30 | Discharge: 2018-11-30 | Disposition: A | Discharge status: Home / Self Care | Payer: 59 | Source: Ambulatory Visit

## 2018-12-01 DIAGNOSIS — M542 Cervicalgia: Secondary | ICD-10-CM | POA: Diagnosis not present

## 2018-12-02 ENCOUNTER — Other Ambulatory Visit (HOSPITAL_COMMUNITY): Payer: 59

## 2018-12-09 ENCOUNTER — Ambulatory Visit (HOSPITAL_COMMUNITY): Admission: RE | Admit: 2018-12-09 | Payer: 59 | Source: Ambulatory Visit | Admitting: Orthopedic Surgery

## 2018-12-09 ENCOUNTER — Encounter (HOSPITAL_COMMUNITY): Admission: RE | Payer: Self-pay | Source: Ambulatory Visit

## 2018-12-09 SURGERY — ANTERIOR CERVICAL DECOMPRESSION/DISCECTOMY FUSION 2 LEVELS
Anesthesia: General

## 2018-12-28 DIAGNOSIS — M545 Low back pain: Secondary | ICD-10-CM | POA: Diagnosis not present

## 2018-12-29 DIAGNOSIS — M792 Neuralgia and neuritis, unspecified: Secondary | ICD-10-CM | POA: Diagnosis not present

## 2018-12-29 DIAGNOSIS — G609 Hereditary and idiopathic neuropathy, unspecified: Secondary | ICD-10-CM | POA: Diagnosis not present

## 2019-01-08 DIAGNOSIS — M545 Low back pain: Secondary | ICD-10-CM | POA: Diagnosis not present

## 2019-01-11 DIAGNOSIS — E119 Type 2 diabetes mellitus without complications: Secondary | ICD-10-CM | POA: Diagnosis not present

## 2019-01-11 DIAGNOSIS — I10 Essential (primary) hypertension: Secondary | ICD-10-CM | POA: Diagnosis not present

## 2019-01-11 DIAGNOSIS — E782 Mixed hyperlipidemia: Secondary | ICD-10-CM | POA: Diagnosis not present

## 2019-01-11 DIAGNOSIS — E559 Vitamin D deficiency, unspecified: Secondary | ICD-10-CM | POA: Diagnosis not present

## 2019-01-16 DIAGNOSIS — M545 Low back pain: Secondary | ICD-10-CM | POA: Diagnosis not present

## 2019-02-16 DIAGNOSIS — G4733 Obstructive sleep apnea (adult) (pediatric): Secondary | ICD-10-CM | POA: Diagnosis not present

## 2019-02-16 DIAGNOSIS — E782 Mixed hyperlipidemia: Secondary | ICD-10-CM | POA: Diagnosis not present

## 2019-02-16 DIAGNOSIS — E559 Vitamin D deficiency, unspecified: Secondary | ICD-10-CM | POA: Diagnosis not present

## 2019-02-16 DIAGNOSIS — I10 Essential (primary) hypertension: Secondary | ICD-10-CM | POA: Diagnosis not present

## 2019-03-23 DIAGNOSIS — G4733 Obstructive sleep apnea (adult) (pediatric): Secondary | ICD-10-CM | POA: Diagnosis not present

## 2019-03-27 DIAGNOSIS — T148XXA Other injury of unspecified body region, initial encounter: Secondary | ICD-10-CM | POA: Diagnosis not present

## 2019-04-05 ENCOUNTER — Telehealth: Payer: Self-pay | Admitting: *Deleted

## 2019-04-05 NOTE — Telephone Encounter (Signed)
Pt requesting covid testing. Reports fever 102.0, body aches, cough, SOB. HAs not alerted PCP.  Speech non-halting during call, mild SOB presently.  Advised to call PCP in AM for covid testing referral to [place].  Advised to take tylenol for fever, go to ED if SOB worsens any symptoms worsen, fever of 103.0.  Pt verbalizes understanding.

## 2019-04-06 ENCOUNTER — Other Ambulatory Visit: Payer: 59

## 2019-04-06 ENCOUNTER — Ambulatory Visit: Payer: Self-pay | Admitting: *Deleted

## 2019-04-06 DIAGNOSIS — Z20822 Contact with and (suspected) exposure to covid-19: Secondary | ICD-10-CM

## 2019-04-06 DIAGNOSIS — E782 Mixed hyperlipidemia: Secondary | ICD-10-CM | POA: Diagnosis not present

## 2019-04-06 DIAGNOSIS — E559 Vitamin D deficiency, unspecified: Secondary | ICD-10-CM | POA: Diagnosis not present

## 2019-04-06 DIAGNOSIS — I10 Essential (primary) hypertension: Secondary | ICD-10-CM | POA: Diagnosis not present

## 2019-04-06 DIAGNOSIS — R05 Cough: Secondary | ICD-10-CM | POA: Diagnosis not present

## 2019-04-06 NOTE — Addendum Note (Signed)
Addended by: Tarry Kos on: 04/06/2019 10:37 AM   Modules accepted: Orders

## 2019-04-06 NOTE — Telephone Encounter (Signed)
Due to symptoms referred by [facility].  [phone] Appointment made for today at 12:45p at the [facility] site.

## 2019-04-08 LAB — NOVEL CORONAVIRUS, NAA: SARS-CoV-2, NAA: NOT DETECTED

## 2019-04-22 DIAGNOSIS — G4733 Obstructive sleep apnea (adult) (pediatric): Secondary | ICD-10-CM | POA: Diagnosis not present

## 2019-04-27 DIAGNOSIS — R51 Headache: Secondary | ICD-10-CM | POA: Diagnosis not present

## 2019-04-27 DIAGNOSIS — M542 Cervicalgia: Secondary | ICD-10-CM | POA: Diagnosis not present

## 2019-04-27 DIAGNOSIS — M25551 Pain in right hip: Secondary | ICD-10-CM | POA: Diagnosis not present

## 2019-04-27 DIAGNOSIS — M79604 Pain in right leg: Secondary | ICD-10-CM | POA: Diagnosis not present

## 2019-04-30 DIAGNOSIS — M25551 Pain in right hip: Secondary | ICD-10-CM | POA: Diagnosis not present

## 2019-04-30 DIAGNOSIS — R51 Headache: Secondary | ICD-10-CM | POA: Diagnosis not present

## 2019-04-30 DIAGNOSIS — M542 Cervicalgia: Secondary | ICD-10-CM | POA: Diagnosis not present

## 2019-04-30 DIAGNOSIS — M79604 Pain in right leg: Secondary | ICD-10-CM | POA: Diagnosis not present

## 2019-05-06 ENCOUNTER — Telehealth: Payer: Self-pay | Admitting: Adult Health

## 2019-05-06 ENCOUNTER — Ambulatory Visit (INDEPENDENT_AMBULATORY_CARE_PROVIDER_SITE_OTHER): Payer: 59 | Admitting: Pulmonary Disease

## 2019-05-06 ENCOUNTER — Ambulatory Visit: Payer: 59

## 2019-05-06 ENCOUNTER — Other Ambulatory Visit: Payer: Self-pay

## 2019-05-06 ENCOUNTER — Encounter: Payer: Self-pay | Admitting: Pulmonary Disease

## 2019-05-06 VITALS — BP 132/62 | HR 87 | Temp 98.0°F | Ht 73.0 in | Wt 270.8 lb

## 2019-05-06 DIAGNOSIS — R05 Cough: Secondary | ICD-10-CM | POA: Diagnosis not present

## 2019-05-06 DIAGNOSIS — R059 Cough, unspecified: Secondary | ICD-10-CM

## 2019-05-06 MED ORDER — LEVOFLOXACIN 750 MG PO TABS: 750.0000 mg | ORAL_TABLET | Freq: Every day | ORAL | 0 refills | Status: DC

## 2019-05-06 MED ORDER — PREDNISONE 10 MG PO TABS: 10.0000 mg | ORAL_TABLET | Freq: Two times a day (BID) | ORAL | 0 refills | Status: DC

## 2019-05-06 NOTE — Patient Instructions (Signed)
Acute sinusitis  Levaquin 750 p.o. daily Prednisone 10 p.o. twice daily  If Tums not improving in about a week then obtain the chest x-ray  We will see back in about 4 to 6 weeks  We may need to get a breathing study at that time  Call with any significant concerns

## 2019-05-06 NOTE — Telephone Encounter (Signed)
Pt calling to set up a consult for himself- c/o sinus congestion qam, cough with deep breath, joint pain (knees, elbow), muscle aches (back, shoulders), increased SOB with exertion, chest tightness.  S/s present X1 wk.  Pt was tested for Covid-19 approx 1 month ago, test was negative. Pt has not since followed up with PCP regarding these new symptoms.    Routing to [place] to see if pt can be scheduled here in office as a consult.  Tonya please advise.  Thanks!

## 2019-05-06 NOTE — Telephone Encounter (Signed)
Called and spoke with pt letting him know that we could get him in for a consult. Was going to schedule pt for consult with AO 7/27 but pt requested to be seen for consult ASAP. Stated to pt that AO had an opening at 4:30 this afternoon and pt stated he would take that appt. Pt has been scheduled for consult today at 4:30 with AO. Nothing further needed.

## 2019-05-06 NOTE — Telephone Encounter (Signed)
I spoke with Dr. Ander Slade and he said that he would be glad to see the patient as a new consult. Thanks.

## 2019-05-06 NOTE — Progress Notes (Signed)
Subjective:    Patient ID: Eric Galvan, male    DOB: 11-08-1965, 53 y.o.   MRN: [id number]  Cough, sinus congestion, chest tightness  Symptoms occurring for about a week No history of chronic lung disease  Remote smoking history over 20 years ago No ongoing history of lung disease  Has history of obstructive sleep apnea-compliant with CPAP therapy, no concerns about his obstructive sleep apnea  Eric Galvan does have associated cough, chest tightness Not really bringing up any secretions   Review of Systems  Constitutional: Negative for fever and unexpected weight change.  HENT: Negative for congestion, dental problem, ear pain, nosebleeds, postnasal drip, rhinorrhea, sinus pressure, sneezing, sore throat and trouble swallowing.   Eyes: Negative for redness and itching.  Respiratory: Positive for cough and chest tightness. Negative for shortness of breath and wheezing.   Cardiovascular: Negative for palpitations and leg swelling.  Gastrointestinal: Negative for nausea and vomiting.  Genitourinary: Negative for dysuria.  Musculoskeletal: Negative for joint swelling.  Skin: Negative for rash.  Allergic/Immunologic: Positive for food allergies. Negative for environmental allergies and immunocompromised state.  Neurological: Negative for headaches.  Hematological: Does not bruise/bleed easily.  Psychiatric/Behavioral: Negative for dysphoric mood. The patient is not nervous/anxious.    Past Medical History:  Diagnosis Date  . Allergy   . Diabetes mellitus   . Hyperlipidemia   . Sleep apnea    Social History   Socioeconomic History  . Marital status: Married    Spouse name: Not on file  . Number of children: 2  . Years of education: Not on file  . Highest education level: Not on file  Occupational History  . Occupation: Engineer, maintenance    Comment: [employer]  Social Needs  . Financial resource strain: Not on file  . Food insecurity    Worry: Not on file    Inability: Not  on file  . Transportation needs    Medical: Not on file    Non-medical: Not on file  Tobacco Use  . Smoking status: Former Smoker    Quit date: 03/23/2002    Years since quitting: 17.1  . Smokeless tobacco: Never Used  Substance and Sexual Activity  . Alcohol use: Yes    Alcohol/week: 2.0 standard drinks    Types: 2 drink(s) per week    Comment: mixed drinks  . Drug use: No  . Sexual activity: Not on file  Lifestyle  . Physical activity    Days per week: Not on file    Minutes per session: Not on file  . Stress: Not on file  Relationships  . Social Herbalist on phone: Not on file    Gets together: Not on file    Attends religious service: Not on file    Active member of club or organization: Not on file    Attends meetings of clubs or organizations: Not on file    Relationship status: Not on file  . Intimate partner violence    Fear of current or ex partner: Not on file    Emotionally abused: Not on file    Physically abused: Not on file    Forced sexual activity: Not on file  Other Topics Concern  . Not on file  Social History Narrative   Lives with his wife and one of their two children.   Family History  Problem Relation Age of Onset  . Cancer Other   . Hypertension Other   . Diabetes Mother   .  Alzheimer's disease Mother   . Diabetes Father        Objective:   Physical Exam Constitutional:      Appearance: Normal appearance.  HENT:     Head: Normocephalic and atraumatic.  Neck:     Musculoskeletal: Normal range of motion and neck supple. No neck rigidity or muscular tenderness.  Cardiovascular:     Rate and Rhythm: Normal rate and regular rhythm.     Pulses: Normal pulses.     Heart sounds: Normal heart sounds. No murmur.  Pulmonary:     Effort: Pulmonary effort is normal.     Breath sounds: Normal breath sounds.  Abdominal:     General: There is no distension.  Musculoskeletal: Normal range of motion.        General: No swelling or  tenderness.  Skin:    General: Skin is warm and dry.     Coloration: Skin is not jaundiced or pale.  Neurological:     General: No focal deficit present.     Mental Status: Eric Galvan is alert.     Cranial Nerves: No cranial nerve deficit.     Sensory: No sensory deficit.  Psychiatric:        Mood and Affect: Mood normal.        Behavior: Behavior normal.    Vitals:   05/06/19 1636  BP: 132/62  Pulse: 87  Temp: 98 F (36.7 C)  SpO2: 97%      Assessment & Plan:  Marland Kitchen  Acute sinusitis  .Chest congestion  -Has no underlying lung disease known to him -Persistent of symptoms led to presentation  Plan: -Course of Levaquin 750 p.o. daily for 5 days -Course of prednisone 10 p.o. twice daily for 5 days  -Plan for chest x-ray in about a week if nonresolution of symptoms  -Consider pulmonary function study if persistence of symptoms  -We will see him back in the office in about 4-6 weeks  -Encouraged to call with any significant concerns

## 2019-05-07 DIAGNOSIS — M79604 Pain in right leg: Secondary | ICD-10-CM | POA: Diagnosis not present

## 2019-05-07 DIAGNOSIS — R51 Headache: Secondary | ICD-10-CM | POA: Diagnosis not present

## 2019-05-07 DIAGNOSIS — M25551 Pain in right hip: Secondary | ICD-10-CM | POA: Diagnosis not present

## 2019-05-07 DIAGNOSIS — M542 Cervicalgia: Secondary | ICD-10-CM | POA: Diagnosis not present

## 2019-05-10 DIAGNOSIS — E782 Mixed hyperlipidemia: Secondary | ICD-10-CM | POA: Diagnosis not present

## 2019-05-10 DIAGNOSIS — E559 Vitamin D deficiency, unspecified: Secondary | ICD-10-CM | POA: Diagnosis not present

## 2019-05-10 DIAGNOSIS — I10 Essential (primary) hypertension: Secondary | ICD-10-CM | POA: Diagnosis not present

## 2019-05-10 DIAGNOSIS — G4733 Obstructive sleep apnea (adult) (pediatric): Secondary | ICD-10-CM | POA: Diagnosis not present

## 2019-05-10 DIAGNOSIS — E119 Type 2 diabetes mellitus without complications: Secondary | ICD-10-CM | POA: Diagnosis not present

## 2019-05-11 DIAGNOSIS — M79604 Pain in right leg: Secondary | ICD-10-CM | POA: Diagnosis not present

## 2019-05-11 DIAGNOSIS — M542 Cervicalgia: Secondary | ICD-10-CM | POA: Diagnosis not present

## 2019-05-11 DIAGNOSIS — M25551 Pain in right hip: Secondary | ICD-10-CM | POA: Diagnosis not present

## 2019-05-11 DIAGNOSIS — R51 Headache: Secondary | ICD-10-CM | POA: Diagnosis not present

## 2019-05-17 ENCOUNTER — Ambulatory Visit (INDEPENDENT_AMBULATORY_CARE_PROVIDER_SITE_OTHER): Payer: 59

## 2019-05-17 DIAGNOSIS — R05 Cough: Secondary | ICD-10-CM | POA: Diagnosis not present

## 2019-05-18 DIAGNOSIS — M542 Cervicalgia: Secondary | ICD-10-CM | POA: Diagnosis not present

## 2019-05-18 DIAGNOSIS — R51 Headache: Secondary | ICD-10-CM | POA: Diagnosis not present

## 2019-05-18 DIAGNOSIS — M79604 Pain in right leg: Secondary | ICD-10-CM | POA: Diagnosis not present

## 2019-05-18 DIAGNOSIS — M25551 Pain in right hip: Secondary | ICD-10-CM | POA: Diagnosis not present

## 2019-05-20 DIAGNOSIS — M79604 Pain in right leg: Secondary | ICD-10-CM | POA: Diagnosis not present

## 2019-05-20 DIAGNOSIS — M25551 Pain in right hip: Secondary | ICD-10-CM | POA: Diagnosis not present

## 2019-05-20 DIAGNOSIS — M542 Cervicalgia: Secondary | ICD-10-CM | POA: Diagnosis not present

## 2019-05-20 DIAGNOSIS — R51 Headache: Secondary | ICD-10-CM | POA: Diagnosis not present

## 2019-05-23 DIAGNOSIS — G4733 Obstructive sleep apnea (adult) (pediatric): Secondary | ICD-10-CM | POA: Diagnosis not present

## 2019-05-25 DIAGNOSIS — M25551 Pain in right hip: Secondary | ICD-10-CM | POA: Diagnosis not present

## 2019-05-25 DIAGNOSIS — M79604 Pain in right leg: Secondary | ICD-10-CM | POA: Diagnosis not present

## 2019-05-25 DIAGNOSIS — R51 Headache: Secondary | ICD-10-CM | POA: Diagnosis not present

## 2019-05-25 DIAGNOSIS — M542 Cervicalgia: Secondary | ICD-10-CM | POA: Diagnosis not present

## 2019-06-01 DIAGNOSIS — R51 Headache: Secondary | ICD-10-CM | POA: Diagnosis not present

## 2019-06-01 DIAGNOSIS — M542 Cervicalgia: Secondary | ICD-10-CM | POA: Diagnosis not present

## 2019-06-01 DIAGNOSIS — M25551 Pain in right hip: Secondary | ICD-10-CM | POA: Diagnosis not present

## 2019-06-01 DIAGNOSIS — M79604 Pain in right leg: Secondary | ICD-10-CM | POA: Diagnosis not present

## 2019-06-09 DIAGNOSIS — M542 Cervicalgia: Secondary | ICD-10-CM | POA: Diagnosis not present

## 2019-06-09 DIAGNOSIS — M79604 Pain in right leg: Secondary | ICD-10-CM | POA: Diagnosis not present

## 2019-06-09 DIAGNOSIS — R51 Headache: Secondary | ICD-10-CM | POA: Diagnosis not present

## 2019-06-09 DIAGNOSIS — M25551 Pain in right hip: Secondary | ICD-10-CM | POA: Diagnosis not present

## 2019-06-23 DIAGNOSIS — G4733 Obstructive sleep apnea (adult) (pediatric): Secondary | ICD-10-CM | POA: Diagnosis not present

## 2019-07-02 DIAGNOSIS — M25551 Pain in right hip: Secondary | ICD-10-CM | POA: Diagnosis not present

## 2019-07-02 DIAGNOSIS — M79604 Pain in right leg: Secondary | ICD-10-CM | POA: Diagnosis not present

## 2019-07-02 DIAGNOSIS — M542 Cervicalgia: Secondary | ICD-10-CM | POA: Diagnosis not present

## 2019-07-02 DIAGNOSIS — R51 Headache: Secondary | ICD-10-CM | POA: Diagnosis not present

## 2019-07-14 DIAGNOSIS — R51 Headache: Secondary | ICD-10-CM | POA: Diagnosis not present

## 2019-07-14 DIAGNOSIS — M542 Cervicalgia: Secondary | ICD-10-CM | POA: Diagnosis not present

## 2019-07-14 DIAGNOSIS — M25551 Pain in right hip: Secondary | ICD-10-CM | POA: Diagnosis not present

## 2019-07-14 DIAGNOSIS — M79604 Pain in right leg: Secondary | ICD-10-CM | POA: Diagnosis not present

## 2019-07-23 DIAGNOSIS — G4733 Obstructive sleep apnea (adult) (pediatric): Secondary | ICD-10-CM | POA: Diagnosis not present

## 2019-08-10 DIAGNOSIS — Z125 Encounter for screening for malignant neoplasm of prostate: Secondary | ICD-10-CM | POA: Diagnosis not present

## 2019-08-10 DIAGNOSIS — N5201 Erectile dysfunction due to arterial insufficiency: Secondary | ICD-10-CM | POA: Diagnosis not present

## 2019-08-23 DIAGNOSIS — G4733 Obstructive sleep apnea (adult) (pediatric): Secondary | ICD-10-CM | POA: Diagnosis not present

## 2019-10-23 DIAGNOSIS — G4733 Obstructive sleep apnea (adult) (pediatric): Secondary | ICD-10-CM | POA: Diagnosis not present

## 2019-11-01 DIAGNOSIS — Z6835 Body mass index (BMI) 35.0-35.9, adult: Secondary | ICD-10-CM | POA: Diagnosis not present

## 2019-11-01 DIAGNOSIS — M542 Cervicalgia: Secondary | ICD-10-CM | POA: Diagnosis not present

## 2019-11-01 DIAGNOSIS — M5030 Other cervical disc degeneration, unspecified cervical region: Secondary | ICD-10-CM | POA: Diagnosis not present

## 2019-11-02 DIAGNOSIS — M5412 Radiculopathy, cervical region: Secondary | ICD-10-CM | POA: Diagnosis not present

## 2019-11-02 DIAGNOSIS — M5030 Other cervical disc degeneration, unspecified cervical region: Secondary | ICD-10-CM | POA: Diagnosis not present

## 2019-11-05 DIAGNOSIS — E1140 Type 2 diabetes mellitus with diabetic neuropathy, unspecified: Secondary | ICD-10-CM | POA: Diagnosis not present

## 2019-11-05 DIAGNOSIS — E782 Mixed hyperlipidemia: Secondary | ICD-10-CM | POA: Diagnosis not present

## 2019-11-05 DIAGNOSIS — I10 Essential (primary) hypertension: Secondary | ICD-10-CM | POA: Diagnosis not present

## 2019-11-05 DIAGNOSIS — E559 Vitamin D deficiency, unspecified: Secondary | ICD-10-CM | POA: Diagnosis not present

## 2019-11-05 DIAGNOSIS — E1165 Type 2 diabetes mellitus with hyperglycemia: Secondary | ICD-10-CM | POA: Diagnosis not present

## 2019-11-08 ENCOUNTER — Other Ambulatory Visit (HOSPITAL_COMMUNITY): Payer: Self-pay | Admitting: Orthopedic Surgery

## 2019-11-08 DIAGNOSIS — M542 Cervicalgia: Secondary | ICD-10-CM

## 2019-11-13 ENCOUNTER — Ambulatory Visit (HOSPITAL_COMMUNITY)
Admission: RE | Admit: 2019-11-13 | Discharge: 2019-11-13 | Disposition: A | Discharge status: Home / Self Care | Payer: 59 | Source: Ambulatory Visit | Attending: Orthopedic Surgery | Admitting: Orthopedic Surgery

## 2019-11-13 DIAGNOSIS — Z20822 Contact with and (suspected) exposure to covid-19: Secondary | ICD-10-CM | POA: Diagnosis not present

## 2019-11-13 DIAGNOSIS — Z01812 Encounter for preprocedural laboratory examination: Secondary | ICD-10-CM | POA: Insufficient documentation

## 2019-11-13 LAB — SARS CORONAVIRUS 2 (TAT 6-24 HRS): SARS Coronavirus 2: NEGATIVE

## 2019-11-15 ENCOUNTER — Other Ambulatory Visit: Payer: Self-pay

## 2019-11-15 ENCOUNTER — Encounter (HOSPITAL_COMMUNITY): Payer: Self-pay | Admitting: Orthopedic Surgery

## 2019-11-15 NOTE — Progress Notes (Signed)
Patient denies shortness of breath, fever, cough and chest pain.  PCP -  Dr Iona Beard [place] Cardiologist - denies  Chest x-ray - 05/17/19, 2 view EKG - 11/16/19 DOS Stress Test - 11/03/15 ECHO - denies Cardiac Cath - denies  Sleep Study - Yes CPAP - Uses CPAP nightly  Fasting Blood Sugar - Unknown  Checks Blood Sugar _0__ times a day Patient does not check blood sugar.  . Do not take oral diabetes medicines (Janumet) the morning of surgery.  STOP now taking any Aspirin (unless otherwise instructed by your surgeon), Aleve, Naproxen, Ibuprofen, Motrin, Advil, Goody's, BC's, all herbal medications, fish oil, and all vitamins.   Coronavirus Screening Covid test on 11/13/19 was negative.  Patient verbalized understanding of instructions that were given via phone.

## 2019-11-16 ENCOUNTER — Ambulatory Visit (HOSPITAL_COMMUNITY): Payer: BC Managed Care – PPO | Admitting: Certified Registered"

## 2019-11-16 ENCOUNTER — Ambulatory Visit (HOSPITAL_COMMUNITY)
Admission: RE | Admit: 2019-11-16 | Discharge: 2019-11-16 | Disposition: A | Discharge status: Home / Self Care | Payer: BC Managed Care – PPO | Source: Ambulatory Visit | Attending: Orthopedic Surgery | Admitting: Orthopedic Surgery

## 2019-11-16 ENCOUNTER — Encounter (HOSPITAL_COMMUNITY)
Admission: RE | Disposition: A | Discharge status: Home / Self Care | Payer: Self-pay | Source: Home / Self Care | Attending: Orthopedic Surgery

## 2019-11-16 ENCOUNTER — Ambulatory Visit (HOSPITAL_COMMUNITY)
Admission: RE | Admit: 2019-11-16 | Discharge: 2019-11-16 | Disposition: A | Discharge status: Home / Self Care | Payer: BC Managed Care – PPO | Attending: Orthopedic Surgery | Admitting: Orthopedic Surgery

## 2019-11-16 ENCOUNTER — Encounter (HOSPITAL_COMMUNITY): Payer: Self-pay | Admitting: Orthopedic Surgery

## 2019-11-16 ENCOUNTER — Other Ambulatory Visit: Payer: Self-pay

## 2019-11-16 DIAGNOSIS — G4730 Sleep apnea, unspecified: Secondary | ICD-10-CM | POA: Insufficient documentation

## 2019-11-16 DIAGNOSIS — M4802 Spinal stenosis, cervical region: Secondary | ICD-10-CM | POA: Diagnosis not present

## 2019-11-16 DIAGNOSIS — Z87891 Personal history of nicotine dependence: Secondary | ICD-10-CM | POA: Diagnosis not present

## 2019-11-16 DIAGNOSIS — M542 Cervicalgia: Secondary | ICD-10-CM | POA: Insufficient documentation

## 2019-11-16 DIAGNOSIS — Z6836 Body mass index (BMI) 36.0-36.9, adult: Secondary | ICD-10-CM | POA: Insufficient documentation

## 2019-11-16 DIAGNOSIS — M47812 Spondylosis without myelopathy or radiculopathy, cervical region: Secondary | ICD-10-CM | POA: Insufficient documentation

## 2019-11-16 DIAGNOSIS — E669 Obesity, unspecified: Secondary | ICD-10-CM | POA: Insufficient documentation

## 2019-11-16 DIAGNOSIS — E119 Type 2 diabetes mellitus without complications: Secondary | ICD-10-CM | POA: Diagnosis not present

## 2019-11-16 DIAGNOSIS — K219 Gastro-esophageal reflux disease without esophagitis: Secondary | ICD-10-CM | POA: Diagnosis not present

## 2019-11-16 DIAGNOSIS — E785 Hyperlipidemia, unspecified: Secondary | ICD-10-CM | POA: Diagnosis not present

## 2019-11-16 DIAGNOSIS — M50221 Other cervical disc displacement at C4-C5 level: Secondary | ICD-10-CM | POA: Diagnosis not present

## 2019-11-16 DIAGNOSIS — Z7984 Long term (current) use of oral hypoglycemic drugs: Secondary | ICD-10-CM | POA: Diagnosis not present

## 2019-11-16 HISTORY — DX: Gastro-esophageal reflux disease without esophagitis: K21.9

## 2019-11-16 HISTORY — PX: RADIOLOGY WITH ANESTHESIA: SHX6223

## 2019-11-16 LAB — GLUCOSE, CAPILLARY
Glucose-Capillary: 173 mg/dL — ABNORMAL HIGH (ref 70–99)
Glucose-Capillary: 160 mg/dL — ABNORMAL HIGH (ref 70–99)

## 2019-11-16 LAB — BASIC METABOLIC PANEL
Anion gap: 11 (ref 5–15)
BUN: 16 mg/dL (ref 6–20)
CO2: 24 mmol/L (ref 22–32)
Calcium: 9.4 mg/dL (ref 8.9–10.3)
Chloride: 103 mmol/L (ref 98–111)
Creatinine, Ser: 0.88 mg/dL (ref 0.61–1.24)
GFR calc Af Amer: >60 mL/min (ref >60)
GFR calc non Af Amer: >60 mL/min (ref >60)
Glucose, Bld: 165 mg/dL — ABNORMAL HIGH (ref 70–99)
Potassium: 4.1 mmol/L (ref 3.5–5.1)
Sodium: 138 mmol/L (ref 135–145)

## 2019-11-16 LAB — CBC
HCT: 41.2 % (ref 39.0–52.0)
Hemoglobin: 13.4 g/dL (ref 13.0–17.0)
MCH: 28.6 pg (ref 26.0–34.0)
MCHC: 32.5 g/dL (ref 30.0–36.0)
MCV: 87.8 fL (ref 80.0–100.0)
Platelets: 360 10*3/uL (ref 150–400)
RBC: 4.69 MIL/uL (ref 4.22–5.81)
RDW: 12.7 % (ref 11.5–15.5)
WBC: 5.7 10*3/uL (ref 4.0–10.5)
nRBC: 0 % (ref 0.0–0.2)

## 2019-11-16 SURGERY — MRI WITH ANESTHESIA
Anesthesia: General

## 2019-11-16 MED ORDER — FENTANYL CITRATE (PF) 250 MCG/5ML IJ SOLN
INTRAMUSCULAR | Status: DC | PRN
  Administered 2019-11-16: 50 ug via INTRAVENOUS
  Administered 2019-11-16 (×2): 25 ug via INTRAVENOUS

## 2019-11-16 MED ORDER — ONDANSETRON HCL 4 MG/2ML IJ SOLN: 4.0000 mg | Freq: Once | INTRAMUSCULAR | Status: DC | PRN

## 2019-11-16 MED ORDER — ONDANSETRON HCL 4 MG/2ML IJ SOLN
INTRAMUSCULAR | Status: DC | PRN
  Administered 2019-11-16: 4 mg via INTRAVENOUS

## 2019-11-16 MED ORDER — LACTATED RINGERS IV SOLN: INTRAVENOUS | Status: DC

## 2019-11-16 MED ORDER — LIDOCAINE HCL (CARDIAC) PF 100 MG/5ML IV SOSY
PREFILLED_SYRINGE | INTRAVENOUS | Status: DC | PRN
  Administered 2019-11-16: 100 mg via INTRATRACHEAL

## 2019-11-16 MED ORDER — FENTANYL CITRATE (PF) 100 MCG/2ML IJ SOLN: 25.0000 ug | INTRAMUSCULAR | Status: DC | PRN

## 2019-11-16 MED ORDER — PROPOFOL 10 MG/ML IV BOLUS
INTRAVENOUS | Status: DC | PRN
  Administered 2019-11-16: 200 mg via INTRAVENOUS

## 2019-11-16 NOTE — Anesthesia Postprocedure Evaluation (Signed)
Anesthesia Post Note  Patient: MAX SWEENY  Procedure(s) Performed: MRI WITH ANESTHESIA CERVICAL SPINE WITH OUT CONTRAST (N/A )     Patient location during evaluation: PACU Anesthesia Type: General Level of consciousness: awake and alert Pain management: pain level controlled Vital Signs Assessment: post-procedure vital signs reviewed and stable Respiratory status: spontaneous breathing, nonlabored ventilation and respiratory function stable Cardiovascular status: blood pressure returned to baseline and stable Postop Assessment: no apparent nausea or vomiting Anesthetic complications: no    Last Vitals:  Vitals:   11/16/19 0930 11/16/19 0945  BP: 130/80 (!) 145/80  Pulse: 77 69  Resp: 17 17  Temp: 36.5 C 36.7 C  SpO2: 98% 97%    Last Pain:  Vitals:   11/16/19 0945  TempSrc:   PainSc: 0-No pain                 Catalina Gravel

## 2019-11-16 NOTE — H&P (Signed)
Anesthesia H&P Update: History and Physical Exam reviewed; patient is OK for planned anesthetic and procedure. ? ?

## 2019-11-16 NOTE — Transfer of Care (Signed)
Immediate Anesthesia Transfer of Care Note  Patient: LYONEL BLANE  Procedure(s) Performed: MRI WITH ANESTHESIA CERVICAL SPINE WITH OUT CONTRAST (N/A )  Patient Location: PACU  Anesthesia Type:General  Level of Consciousness: awake, alert , oriented and patient cooperative  Airway & Oxygen Therapy: Patient Spontanous Breathing  Post-op Assessment: Report given to RN and Post -op Vital signs reviewed and stable  Post vital signs: Reviewed and stable  Last Vitals:  Vitals Value Taken Time  BP 130/80 11/16/19 0930  Temp    Pulse 78 11/16/19 0932  Resp 13 11/16/19 0932  SpO2 98 % 11/16/19 0932  Vitals shown include unvalidated device data.  Last Pain:  Vitals:   11/16/19 0728  TempSrc: Oral      Patients Stated Pain Goal: 3 ([id number])  Complications: No apparent anesthesia complications

## 2019-11-16 NOTE — Anesthesia Procedure Notes (Signed)
Procedure Name: LMA Insertion Date/Time: 11/16/2019 8:48 AM Performed by: Janace Litten, CRNA Pre-anesthesia Checklist: Patient identified, Emergency Drugs available, Suction available and Patient being monitored Patient Re-evaluated:Patient Re-evaluated prior to induction Oxygen Delivery Method: Circle System Utilized Preoxygenation: Pre-oxygenation with 100% oxygen Induction Type: IV induction Ventilation: Mask ventilation without difficulty LMA: LMA inserted LMA Size: 5.0 Number of attempts: 1 Airway Equipment and Method: Bite block Placement Confirmation: positive ETCO2 Tube secured with: Tape Dental Injury: Teeth and Oropharynx as per pre-operative assessment

## 2019-11-16 NOTE — Anesthesia Preprocedure Evaluation (Addendum)
Anesthesia Evaluation  Patient identified by MRN, date of birth, ID band Patient awake    Reviewed: Allergy & Precautions, NPO status , Patient's Chart, lab work & pertinent test results  Airway Mallampati: III  TM Distance: >3 FB Neck ROM: Full    Dental  (+) Teeth Intact, Dental Advisory Given   Pulmonary sleep apnea and Continuous Positive Airway Pressure Ventilation , former smoker,    Pulmonary exam normal breath sounds clear to auscultation       Cardiovascular negative cardio ROS Normal cardiovascular exam Rhythm:Regular Rate:Normal     Neuro/Psych Neck pain     GI/Hepatic Neg liver ROS, GERD  Medicated,  Endo/Other  diabetes, Type 2, Oral Hypoglycemic AgentsObesity   Renal/GU negative Renal ROS     Musculoskeletal negative musculoskeletal ROS (+)   Abdominal   Peds  Hematology negative hematology ROS (+)   Anesthesia Other Findings Day of surgery medications reviewed with the patient.  Reproductive/Obstetrics                            Anesthesia Physical Anesthesia Plan  ASA: II  Anesthesia Plan: General   Post-op Pain Management:    Induction: Intravenous  PONV Risk Score and Plan: 2 and Dexamethasone, Ondansetron and Treatment may vary due to age or medical condition  Airway Management Planned: LMA  Additional Equipment:   Intra-op Plan:   Post-operative Plan: Extubation in OR  Informed Consent: I have reviewed the patients History and Physical, chart, labs and discussed the procedure including the risks, benefits and alternatives for the proposed anesthesia with the patient or authorized representative who has indicated his/her understanding and acceptance.     Dental advisory given  Plan Discussed with: CRNA  Anesthesia Plan Comments:         Anesthesia Quick Evaluation

## 2019-11-17 ENCOUNTER — Encounter: Payer: Self-pay | Admitting: *Deleted

## 2019-11-18 DIAGNOSIS — M5412 Radiculopathy, cervical region: Secondary | ICD-10-CM | POA: Diagnosis not present

## 2019-11-18 DIAGNOSIS — Z6835 Body mass index (BMI) 35.0-35.9, adult: Secondary | ICD-10-CM | POA: Diagnosis not present

## 2019-11-18 DIAGNOSIS — M5030 Other cervical disc degeneration, unspecified cervical region: Secondary | ICD-10-CM | POA: Diagnosis not present

## 2019-11-19 DIAGNOSIS — M5412 Radiculopathy, cervical region: Secondary | ICD-10-CM | POA: Diagnosis not present

## 2019-11-20 ENCOUNTER — Ambulatory Visit (HOSPITAL_COMMUNITY): Payer: 59

## 2019-11-23 ENCOUNTER — Ambulatory Visit (HOSPITAL_COMMUNITY): Payer: 59

## 2019-11-23 DIAGNOSIS — G4733 Obstructive sleep apnea (adult) (pediatric): Secondary | ICD-10-CM | POA: Diagnosis not present

## 2019-11-25 DIAGNOSIS — M5030 Other cervical disc degeneration, unspecified cervical region: Secondary | ICD-10-CM | POA: Diagnosis not present

## 2019-11-25 DIAGNOSIS — M5412 Radiculopathy, cervical region: Secondary | ICD-10-CM | POA: Diagnosis not present

## 2019-12-02 DIAGNOSIS — M79602 Pain in left arm: Secondary | ICD-10-CM | POA: Diagnosis not present

## 2019-12-02 DIAGNOSIS — M545 Low back pain: Secondary | ICD-10-CM | POA: Diagnosis not present

## 2019-12-02 DIAGNOSIS — M5412 Radiculopathy, cervical region: Secondary | ICD-10-CM | POA: Diagnosis not present

## 2019-12-02 DIAGNOSIS — M542 Cervicalgia: Secondary | ICD-10-CM | POA: Diagnosis not present

## 2019-12-13 DIAGNOSIS — M542 Cervicalgia: Secondary | ICD-10-CM | POA: Diagnosis not present

## 2019-12-13 DIAGNOSIS — M4802 Spinal stenosis, cervical region: Secondary | ICD-10-CM | POA: Diagnosis not present

## 2019-12-13 DIAGNOSIS — M5412 Radiculopathy, cervical region: Secondary | ICD-10-CM | POA: Diagnosis not present

## 2019-12-13 DIAGNOSIS — M5030 Other cervical disc degeneration, unspecified cervical region: Secondary | ICD-10-CM | POA: Diagnosis not present

## 2019-12-15 DIAGNOSIS — M5412 Radiculopathy, cervical region: Secondary | ICD-10-CM | POA: Diagnosis not present

## 2019-12-15 DIAGNOSIS — M545 Low back pain: Secondary | ICD-10-CM | POA: Diagnosis not present

## 2019-12-15 DIAGNOSIS — M542 Cervicalgia: Secondary | ICD-10-CM | POA: Diagnosis not present

## 2019-12-15 DIAGNOSIS — M79602 Pain in left arm: Secondary | ICD-10-CM | POA: Diagnosis not present

## 2019-12-17 DIAGNOSIS — M545 Low back pain: Secondary | ICD-10-CM | POA: Diagnosis not present

## 2019-12-17 DIAGNOSIS — M79602 Pain in left arm: Secondary | ICD-10-CM | POA: Diagnosis not present

## 2019-12-17 DIAGNOSIS — M542 Cervicalgia: Secondary | ICD-10-CM | POA: Diagnosis not present

## 2019-12-17 DIAGNOSIS — M5412 Radiculopathy, cervical region: Secondary | ICD-10-CM | POA: Diagnosis not present

## 2019-12-20 ENCOUNTER — Ambulatory Visit: Payer: 59 | Attending: Internal Medicine

## 2019-12-20 DIAGNOSIS — Z23 Encounter for immunization: Secondary | ICD-10-CM | POA: Insufficient documentation

## 2019-12-20 NOTE — Progress Notes (Signed)
   Covid-19 Vaccination Clinic  Name:  Eric Galvan    MRN: [id number] DOB: April 06, 1966  12/20/2019  Eric Galvan was observed post Covid-19 immunization for 15 minutes without incidence. He was provided with Vaccine Information Sheet and instruction to access the V-Safe system.   Eric Galvan was instructed to call 911 with any severe reactions post vaccine: Marland Kitchen Difficulty breathing  . Swelling of your face and throat  . A fast heartbeat  . A bad rash all over your body  . Dizziness and weakness    Immunizations Administered    Name Date Dose VIS Date Route   Pfizer COVID-19 Vaccine 12/20/2019  4:37 PM 0.3 mL 10/01/2019 Intramuscular   Manufacturer: [place]   Lot: [id number]   [place]: [id number]

## 2019-12-21 ENCOUNTER — Ambulatory Visit: Payer: 59 | Attending: Internal Medicine

## 2019-12-21 DIAGNOSIS — G4733 Obstructive sleep apnea (adult) (pediatric): Secondary | ICD-10-CM | POA: Diagnosis not present

## 2020-01-03 DIAGNOSIS — Z125 Encounter for screening for malignant neoplasm of prostate: Secondary | ICD-10-CM | POA: Diagnosis not present

## 2020-01-03 DIAGNOSIS — E1165 Type 2 diabetes mellitus with hyperglycemia: Secondary | ICD-10-CM | POA: Diagnosis not present

## 2020-01-03 DIAGNOSIS — Z1329 Encounter for screening for other suspected endocrine disorder: Secondary | ICD-10-CM | POA: Diagnosis not present

## 2020-01-03 DIAGNOSIS — E782 Mixed hyperlipidemia: Secondary | ICD-10-CM | POA: Diagnosis not present

## 2020-01-03 DIAGNOSIS — I10 Essential (primary) hypertension: Secondary | ICD-10-CM | POA: Diagnosis not present

## 2020-01-03 DIAGNOSIS — Z0001 Encounter for general adult medical examination with abnormal findings: Secondary | ICD-10-CM | POA: Diagnosis not present

## 2020-01-03 DIAGNOSIS — G4733 Obstructive sleep apnea (adult) (pediatric): Secondary | ICD-10-CM | POA: Diagnosis not present

## 2020-01-03 DIAGNOSIS — E291 Testicular hypofunction: Secondary | ICD-10-CM | POA: Diagnosis not present

## 2020-01-03 DIAGNOSIS — Z136 Encounter for screening for cardiovascular disorders: Secondary | ICD-10-CM | POA: Diagnosis not present

## 2020-01-03 DIAGNOSIS — E559 Vitamin D deficiency, unspecified: Secondary | ICD-10-CM | POA: Diagnosis not present

## 2020-01-12 ENCOUNTER — Ambulatory Visit: Payer: 59 | Attending: Internal Medicine

## 2020-01-12 DIAGNOSIS — Z23 Encounter for immunization: Secondary | ICD-10-CM

## 2020-01-12 NOTE — Progress Notes (Signed)
   Covid-19 Vaccination Clinic  Name:  Eric Galvan    MRN: [id number] DOB: 06-28-1966  01/12/2020  [name] was observed post Covid-19 immunization for 15 minutes without incident. He was provided with Vaccine Information Sheet and instruction to access the V-Safe system.   [name] was instructed to call 911 with any severe reactions post vaccine: Marland Kitchen Difficulty breathing  . Swelling of face and throat  . A fast heartbeat  . A bad rash all over body  . Dizziness and weakness   Immunizations Administered    Name Date Dose VIS Date Route   Pfizer COVID-19 Vaccine 01/12/2020  3:39 PM 0.3 mL 10/01/2019 Intramuscular   Manufacturer: [place]   Lot: [id number]   [place]: [id number]

## 2020-01-13 DIAGNOSIS — M79602 Pain in left arm: Secondary | ICD-10-CM | POA: Diagnosis not present

## 2020-01-13 DIAGNOSIS — M545 Low back pain: Secondary | ICD-10-CM | POA: Diagnosis not present

## 2020-01-13 DIAGNOSIS — M5412 Radiculopathy, cervical region: Secondary | ICD-10-CM | POA: Diagnosis not present

## 2020-01-13 DIAGNOSIS — M542 Cervicalgia: Secondary | ICD-10-CM | POA: Diagnosis not present

## 2020-01-17 DIAGNOSIS — M542 Cervicalgia: Secondary | ICD-10-CM | POA: Diagnosis not present

## 2020-01-17 DIAGNOSIS — M545 Low back pain: Secondary | ICD-10-CM | POA: Diagnosis not present

## 2020-01-17 DIAGNOSIS — M79602 Pain in left arm: Secondary | ICD-10-CM | POA: Diagnosis not present

## 2020-01-17 DIAGNOSIS — M5412 Radiculopathy, cervical region: Secondary | ICD-10-CM | POA: Diagnosis not present

## 2020-01-19 DIAGNOSIS — M5412 Radiculopathy, cervical region: Secondary | ICD-10-CM | POA: Diagnosis not present

## 2020-01-19 DIAGNOSIS — M542 Cervicalgia: Secondary | ICD-10-CM | POA: Diagnosis not present

## 2020-01-19 DIAGNOSIS — M79602 Pain in left arm: Secondary | ICD-10-CM | POA: Diagnosis not present

## 2020-01-19 DIAGNOSIS — M545 Low back pain: Secondary | ICD-10-CM | POA: Diagnosis not present

## 2020-01-21 DIAGNOSIS — G4733 Obstructive sleep apnea (adult) (pediatric): Secondary | ICD-10-CM | POA: Diagnosis not present

## 2020-02-15 DIAGNOSIS — M5412 Radiculopathy, cervical region: Secondary | ICD-10-CM | POA: Diagnosis not present

## 2020-02-15 DIAGNOSIS — M545 Low back pain: Secondary | ICD-10-CM | POA: Diagnosis not present

## 2020-02-15 DIAGNOSIS — M542 Cervicalgia: Secondary | ICD-10-CM | POA: Diagnosis not present

## 2020-02-15 DIAGNOSIS — M79602 Pain in left arm: Secondary | ICD-10-CM | POA: Diagnosis not present

## 2020-02-20 DIAGNOSIS — G4733 Obstructive sleep apnea (adult) (pediatric): Secondary | ICD-10-CM | POA: Diagnosis not present

## 2020-02-28 DIAGNOSIS — M79602 Pain in left arm: Secondary | ICD-10-CM | POA: Diagnosis not present

## 2020-02-28 DIAGNOSIS — M5412 Radiculopathy, cervical region: Secondary | ICD-10-CM | POA: Diagnosis not present

## 2020-02-28 DIAGNOSIS — M545 Low back pain: Secondary | ICD-10-CM | POA: Diagnosis not present

## 2020-02-28 DIAGNOSIS — M542 Cervicalgia: Secondary | ICD-10-CM | POA: Diagnosis not present

## 2020-03-07 DIAGNOSIS — M542 Cervicalgia: Secondary | ICD-10-CM | POA: Diagnosis not present

## 2020-03-07 DIAGNOSIS — M79602 Pain in left arm: Secondary | ICD-10-CM | POA: Diagnosis not present

## 2020-03-07 DIAGNOSIS — M545 Low back pain: Secondary | ICD-10-CM | POA: Diagnosis not present

## 2020-03-07 DIAGNOSIS — M5412 Radiculopathy, cervical region: Secondary | ICD-10-CM | POA: Diagnosis not present

## 2020-03-22 DIAGNOSIS — G4733 Obstructive sleep apnea (adult) (pediatric): Secondary | ICD-10-CM | POA: Diagnosis not present

## 2020-03-30 DIAGNOSIS — M545 Low back pain: Secondary | ICD-10-CM | POA: Diagnosis not present

## 2020-03-30 DIAGNOSIS — M542 Cervicalgia: Secondary | ICD-10-CM | POA: Diagnosis not present

## 2020-03-30 DIAGNOSIS — M79602 Pain in left arm: Secondary | ICD-10-CM | POA: Diagnosis not present

## 2020-03-30 DIAGNOSIS — M5412 Radiculopathy, cervical region: Secondary | ICD-10-CM | POA: Diagnosis not present

## 2020-04-04 DIAGNOSIS — M545 Low back pain: Secondary | ICD-10-CM | POA: Diagnosis not present

## 2020-04-04 DIAGNOSIS — M542 Cervicalgia: Secondary | ICD-10-CM | POA: Diagnosis not present

## 2020-04-04 DIAGNOSIS — M5412 Radiculopathy, cervical region: Secondary | ICD-10-CM | POA: Diagnosis not present

## 2020-04-04 DIAGNOSIS — M79602 Pain in left arm: Secondary | ICD-10-CM | POA: Diagnosis not present

## 2020-04-06 DIAGNOSIS — M545 Low back pain: Secondary | ICD-10-CM | POA: Diagnosis not present

## 2020-04-06 DIAGNOSIS — M5412 Radiculopathy, cervical region: Secondary | ICD-10-CM | POA: Diagnosis not present

## 2020-04-06 DIAGNOSIS — M79602 Pain in left arm: Secondary | ICD-10-CM | POA: Diagnosis not present

## 2020-04-06 DIAGNOSIS — M542 Cervicalgia: Secondary | ICD-10-CM | POA: Diagnosis not present

## 2020-04-17 DIAGNOSIS — M79602 Pain in left arm: Secondary | ICD-10-CM | POA: Diagnosis not present

## 2020-04-17 DIAGNOSIS — M545 Low back pain: Secondary | ICD-10-CM | POA: Diagnosis not present

## 2020-04-17 DIAGNOSIS — M542 Cervicalgia: Secondary | ICD-10-CM | POA: Diagnosis not present

## 2020-04-17 DIAGNOSIS — M5412 Radiculopathy, cervical region: Secondary | ICD-10-CM | POA: Diagnosis not present

## 2020-04-26 DIAGNOSIS — M5412 Radiculopathy, cervical region: Secondary | ICD-10-CM | POA: Diagnosis not present

## 2020-04-26 DIAGNOSIS — M79602 Pain in left arm: Secondary | ICD-10-CM | POA: Diagnosis not present

## 2020-04-26 DIAGNOSIS — M542 Cervicalgia: Secondary | ICD-10-CM | POA: Diagnosis not present

## 2020-04-26 DIAGNOSIS — M545 Low back pain: Secondary | ICD-10-CM | POA: Diagnosis not present

## 2020-05-01 DIAGNOSIS — M47812 Spondylosis without myelopathy or radiculopathy, cervical region: Secondary | ICD-10-CM | POA: Diagnosis not present

## 2020-05-01 DIAGNOSIS — M5412 Radiculopathy, cervical region: Secondary | ICD-10-CM | POA: Diagnosis not present

## 2020-05-01 DIAGNOSIS — M5030 Other cervical disc degeneration, unspecified cervical region: Secondary | ICD-10-CM | POA: Diagnosis not present

## 2020-05-01 DIAGNOSIS — M4802 Spinal stenosis, cervical region: Secondary | ICD-10-CM | POA: Diagnosis not present

## 2020-05-02 DIAGNOSIS — M79602 Pain in left arm: Secondary | ICD-10-CM | POA: Diagnosis not present

## 2020-05-02 DIAGNOSIS — M5412 Radiculopathy, cervical region: Secondary | ICD-10-CM | POA: Diagnosis not present

## 2020-05-02 DIAGNOSIS — M545 Low back pain: Secondary | ICD-10-CM | POA: Diagnosis not present

## 2020-05-02 DIAGNOSIS — M542 Cervicalgia: Secondary | ICD-10-CM | POA: Diagnosis not present

## 2020-05-10 DIAGNOSIS — M79602 Pain in left arm: Secondary | ICD-10-CM | POA: Diagnosis not present

## 2020-05-10 DIAGNOSIS — M545 Low back pain: Secondary | ICD-10-CM | POA: Diagnosis not present

## 2020-05-10 DIAGNOSIS — M5412 Radiculopathy, cervical region: Secondary | ICD-10-CM | POA: Diagnosis not present

## 2020-05-10 DIAGNOSIS — M542 Cervicalgia: Secondary | ICD-10-CM | POA: Diagnosis not present

## 2020-06-15 DIAGNOSIS — E559 Vitamin D deficiency, unspecified: Secondary | ICD-10-CM | POA: Diagnosis not present

## 2020-06-15 DIAGNOSIS — I10 Essential (primary) hypertension: Secondary | ICD-10-CM | POA: Diagnosis not present

## 2020-06-15 DIAGNOSIS — E1165 Type 2 diabetes mellitus with hyperglycemia: Secondary | ICD-10-CM | POA: Diagnosis not present

## 2020-06-15 DIAGNOSIS — E782 Mixed hyperlipidemia: Secondary | ICD-10-CM | POA: Diagnosis not present

## 2020-06-20 ENCOUNTER — Ambulatory Visit: Payer: Self-pay

## 2020-07-26 DIAGNOSIS — M545 Low back pain, unspecified: Secondary | ICD-10-CM | POA: Diagnosis not present

## 2020-07-26 DIAGNOSIS — M79602 Pain in left arm: Secondary | ICD-10-CM | POA: Diagnosis not present

## 2020-07-26 DIAGNOSIS — M542 Cervicalgia: Secondary | ICD-10-CM | POA: Diagnosis not present

## 2020-07-26 DIAGNOSIS — M5412 Radiculopathy, cervical region: Secondary | ICD-10-CM | POA: Diagnosis not present

## 2020-08-01 DIAGNOSIS — M5412 Radiculopathy, cervical region: Secondary | ICD-10-CM | POA: Diagnosis not present

## 2020-08-01 DIAGNOSIS — M79602 Pain in left arm: Secondary | ICD-10-CM | POA: Diagnosis not present

## 2020-08-01 DIAGNOSIS — M545 Low back pain, unspecified: Secondary | ICD-10-CM | POA: Diagnosis not present

## 2020-08-01 DIAGNOSIS — M542 Cervicalgia: Secondary | ICD-10-CM | POA: Diagnosis not present

## 2020-08-08 DIAGNOSIS — M542 Cervicalgia: Secondary | ICD-10-CM | POA: Diagnosis not present

## 2020-08-08 DIAGNOSIS — M545 Low back pain, unspecified: Secondary | ICD-10-CM | POA: Diagnosis not present

## 2020-08-08 DIAGNOSIS — M5412 Radiculopathy, cervical region: Secondary | ICD-10-CM | POA: Diagnosis not present

## 2020-08-08 DIAGNOSIS — M79602 Pain in left arm: Secondary | ICD-10-CM | POA: Diagnosis not present

## 2020-08-16 DIAGNOSIS — M545 Low back pain, unspecified: Secondary | ICD-10-CM | POA: Diagnosis not present

## 2020-08-16 DIAGNOSIS — M79602 Pain in left arm: Secondary | ICD-10-CM | POA: Diagnosis not present

## 2020-08-16 DIAGNOSIS — M542 Cervicalgia: Secondary | ICD-10-CM | POA: Diagnosis not present

## 2020-08-16 DIAGNOSIS — M5412 Radiculopathy, cervical region: Secondary | ICD-10-CM | POA: Diagnosis not present

## 2020-08-27 DIAGNOSIS — L03019 Cellulitis of unspecified finger: Secondary | ICD-10-CM | POA: Diagnosis not present

## 2020-08-29 DIAGNOSIS — R351 Nocturia: Secondary | ICD-10-CM | POA: Diagnosis not present

## 2020-08-29 DIAGNOSIS — N401 Enlarged prostate with lower urinary tract symptoms: Secondary | ICD-10-CM | POA: Diagnosis not present

## 2020-09-26 DIAGNOSIS — M542 Cervicalgia: Secondary | ICD-10-CM | POA: Diagnosis not present

## 2020-09-26 DIAGNOSIS — M79602 Pain in left arm: Secondary | ICD-10-CM | POA: Diagnosis not present

## 2020-09-26 DIAGNOSIS — M545 Low back pain, unspecified: Secondary | ICD-10-CM | POA: Diagnosis not present

## 2020-09-26 DIAGNOSIS — M5412 Radiculopathy, cervical region: Secondary | ICD-10-CM | POA: Diagnosis not present

## 2020-10-03 DIAGNOSIS — M5412 Radiculopathy, cervical region: Secondary | ICD-10-CM | POA: Diagnosis not present

## 2020-10-03 DIAGNOSIS — M79602 Pain in left arm: Secondary | ICD-10-CM | POA: Diagnosis not present

## 2020-10-03 DIAGNOSIS — M545 Low back pain, unspecified: Secondary | ICD-10-CM | POA: Diagnosis not present

## 2020-10-03 DIAGNOSIS — M542 Cervicalgia: Secondary | ICD-10-CM | POA: Diagnosis not present

## 2020-10-10 DIAGNOSIS — M79602 Pain in left arm: Secondary | ICD-10-CM | POA: Diagnosis not present

## 2020-10-10 DIAGNOSIS — M5412 Radiculopathy, cervical region: Secondary | ICD-10-CM | POA: Diagnosis not present

## 2020-10-10 DIAGNOSIS — M542 Cervicalgia: Secondary | ICD-10-CM | POA: Diagnosis not present

## 2020-10-10 DIAGNOSIS — M545 Low back pain, unspecified: Secondary | ICD-10-CM | POA: Diagnosis not present

## 2020-10-11 DIAGNOSIS — Z20822 Contact with and (suspected) exposure to covid-19: Secondary | ICD-10-CM | POA: Diagnosis not present

## 2020-10-11 DIAGNOSIS — Z03818 Encounter for observation for suspected exposure to other biological agents ruled out: Secondary | ICD-10-CM | POA: Diagnosis not present

## 2020-10-27 DIAGNOSIS — M5412 Radiculopathy, cervical region: Secondary | ICD-10-CM | POA: Diagnosis not present

## 2020-10-27 DIAGNOSIS — M47812 Spondylosis without myelopathy or radiculopathy, cervical region: Secondary | ICD-10-CM | POA: Diagnosis not present

## 2020-10-27 DIAGNOSIS — M5030 Other cervical disc degeneration, unspecified cervical region: Secondary | ICD-10-CM | POA: Diagnosis not present

## 2020-10-27 DIAGNOSIS — M4802 Spinal stenosis, cervical region: Secondary | ICD-10-CM | POA: Diagnosis not present

## 2020-10-31 DIAGNOSIS — H524 Presbyopia: Secondary | ICD-10-CM | POA: Diagnosis not present

## 2020-10-31 DIAGNOSIS — H5203 Hypermetropia, bilateral: Secondary | ICD-10-CM | POA: Diagnosis not present

## 2020-10-31 DIAGNOSIS — H52223 Regular astigmatism, bilateral: Secondary | ICD-10-CM | POA: Diagnosis not present

## 2020-11-17 DIAGNOSIS — M545 Low back pain, unspecified: Secondary | ICD-10-CM | POA: Diagnosis not present

## 2020-11-17 DIAGNOSIS — M79602 Pain in left arm: Secondary | ICD-10-CM | POA: Diagnosis not present

## 2020-11-17 DIAGNOSIS — M542 Cervicalgia: Secondary | ICD-10-CM | POA: Diagnosis not present

## 2020-11-17 DIAGNOSIS — M5412 Radiculopathy, cervical region: Secondary | ICD-10-CM | POA: Diagnosis not present

## 2020-11-24 DIAGNOSIS — M545 Low back pain, unspecified: Secondary | ICD-10-CM | POA: Diagnosis not present

## 2020-11-24 DIAGNOSIS — M79602 Pain in left arm: Secondary | ICD-10-CM | POA: Diagnosis not present

## 2020-11-24 DIAGNOSIS — M5412 Radiculopathy, cervical region: Secondary | ICD-10-CM | POA: Diagnosis not present

## 2020-11-24 DIAGNOSIS — M542 Cervicalgia: Secondary | ICD-10-CM | POA: Diagnosis not present

## 2020-11-28 DIAGNOSIS — M542 Cervicalgia: Secondary | ICD-10-CM | POA: Diagnosis not present

## 2020-11-28 DIAGNOSIS — M5412 Radiculopathy, cervical region: Secondary | ICD-10-CM | POA: Diagnosis not present

## 2020-11-28 DIAGNOSIS — M545 Low back pain, unspecified: Secondary | ICD-10-CM | POA: Diagnosis not present

## 2020-11-28 DIAGNOSIS — M79602 Pain in left arm: Secondary | ICD-10-CM | POA: Diagnosis not present

## 2020-12-05 DIAGNOSIS — M79602 Pain in left arm: Secondary | ICD-10-CM | POA: Diagnosis not present

## 2020-12-05 DIAGNOSIS — M545 Low back pain, unspecified: Secondary | ICD-10-CM | POA: Diagnosis not present

## 2020-12-05 DIAGNOSIS — M5412 Radiculopathy, cervical region: Secondary | ICD-10-CM | POA: Diagnosis not present

## 2020-12-05 DIAGNOSIS — M542 Cervicalgia: Secondary | ICD-10-CM | POA: Diagnosis not present

## 2021-01-15 DIAGNOSIS — L03011 Cellulitis of right finger: Secondary | ICD-10-CM | POA: Diagnosis not present

## 2021-01-27 DIAGNOSIS — L03011 Cellulitis of right finger: Secondary | ICD-10-CM | POA: Diagnosis not present

## 2021-04-11 DIAGNOSIS — M79602 Pain in left arm: Secondary | ICD-10-CM | POA: Diagnosis not present

## 2021-04-11 DIAGNOSIS — M542 Cervicalgia: Secondary | ICD-10-CM | POA: Diagnosis not present

## 2021-04-11 DIAGNOSIS — M5412 Radiculopathy, cervical region: Secondary | ICD-10-CM | POA: Diagnosis not present

## 2021-04-11 DIAGNOSIS — M25551 Pain in right hip: Secondary | ICD-10-CM | POA: Diagnosis not present

## 2021-04-17 DIAGNOSIS — M5412 Radiculopathy, cervical region: Secondary | ICD-10-CM | POA: Diagnosis not present

## 2021-04-17 DIAGNOSIS — M5030 Other cervical disc degeneration, unspecified cervical region: Secondary | ICD-10-CM | POA: Diagnosis not present

## 2021-04-17 DIAGNOSIS — M47812 Spondylosis without myelopathy or radiculopathy, cervical region: Secondary | ICD-10-CM | POA: Diagnosis not present

## 2021-05-23 ENCOUNTER — Ambulatory Visit
Admission: EM | Admit: 2021-05-23 | Discharge: 2021-05-23 | Disposition: A | Discharge status: Home / Self Care | Payer: BC Managed Care – PPO | Attending: Emergency Medicine | Admitting: Emergency Medicine

## 2021-05-23 ENCOUNTER — Other Ambulatory Visit: Payer: Self-pay

## 2021-05-23 DIAGNOSIS — L03012 Cellulitis of left finger: Secondary | ICD-10-CM

## 2021-05-23 MED ORDER — IBUPROFEN 800 MG PO TABS: 800.0000 mg | ORAL_TABLET | Freq: Three times a day (TID) | ORAL | 0 refills | Status: DC

## 2021-05-23 MED ORDER — MUPIROCIN 2 % EX OINT: 1.0000 "application " | TOPICAL_OINTMENT | Freq: Two times a day (BID) | CUTANEOUS | 0 refills | Status: DC

## 2021-05-23 MED ORDER — TRAMADOL HCL 50 MG PO TABS: 50.0000 mg | ORAL_TABLET | Freq: Four times a day (QID) | ORAL | 0 refills | Status: DC | PRN

## 2021-05-23 MED ORDER — DOXYCYCLINE HYCLATE 100 MG PO CAPS: 100.0000 mg | ORAL_CAPSULE | Freq: Two times a day (BID) | ORAL | 0 refills | Status: AC

## 2021-05-23 NOTE — ED Provider Notes (Signed)
[facility]    CSN: [id number] Arrival date & time: 05/23/21  1506      History   Chief Complaint Chief Complaint  Patient presents with   finger problem    HPI Eric Galvan is a 55 y.o. male history of DM type II, GERD, hyperlipidemia, presenting today for evaluation of fingernail problem.  Reports over the past 2 weeks he has had swelling and discomfort around his left thumbnail.  Recently has become more painful as well as developed discoloration to it.  Denies fevers.  Denies injury or trauma.  HPI  Past Medical History:  Diagnosis Date   Allergy    Diabetes mellitus    type 2   GERD (gastroesophageal reflux disease)    diet controlled   Hyperlipidemia    diet controlled, no meds   Sleep apnea    uses CPAP nightly    Patient Active Problem List   Diagnosis Date Noted   Back pain 06/05/2018   Special screening for malignant neoplasms, colon 09/10/2013   Dysphagia, unspecified(787.20) 07/09/2013   Diabetes type 2, controlled ([place]) 02/17/2013   Hyperlipidemia 02/17/2013   Allergic rhinitis 02/17/2013    Past Surgical History:  Procedure Laterality Date   cluster headaches     patient states resolved on 11/15/19   COLONOSCOPY     ESOPHAGEAL MANOMETRY N/A 07/08/2018   Procedure: ESOPHAGEAL MANOMETRY (EM);  Surgeon: Wilford Corner, MD;  Location: [facility];  Service: Endoscopy;  Laterality: N/A;   [place] IMPEDANCE STUDY N/A 07/08/2018   Procedure: [place] IMPEDANCE STUDY;  Surgeon: Wilford Corner, MD;  Location: [facility];  Service: Endoscopy;  Laterality: N/A;   RADIOLOGY WITH ANESTHESIA N/A 11/16/2019   Procedure: MRI WITH ANESTHESIA CERVICAL SPINE WITH OUT CONTRAST;  Surgeon: Radiologist, Medication, MD;  Location: [facility];  Service: Radiology;  Laterality: N/A;       Home Medications    Prior to Admission medications   Medication Sig Start Date End Date Taking? Authorizing Provider  doxycycline (VIBRAMYCIN) 100 MG capsule Take 1 capsule  (100 mg total) by mouth 2 (two) times daily for 7 days. 05/23/21 05/30/21 Yes Vanesha Athens C, PA-C  ibuprofen (ADVIL) 800 MG tablet Take 1 tablet (800 mg total) by mouth 3 (three) times daily. 05/23/21  Yes Aimie Wagman C, PA-C  mupirocin ointment (BACTROBAN) 2 % Apply 1 application topically 2 (two) times daily. 05/23/21  Yes Roshaun Pound C, PA-C  traMADol (ULTRAM) 50 MG tablet Take 1 tablet (50 mg total) by mouth every 6 (six) hours as needed. 05/23/21  Yes Harman Ferrin C, PA-C  acetaminophen (TYLENOL) 500 MG tablet Take 1,000 mg by mouth every 6 (six) hours as needed (for pain.).    [provider]  Exenatide ER (BYDUREON) 2 MG PEN Inject 2 mg into the skin every Sunday.    [provider]  famotidine (PEPCID) 20 MG tablet Take 20 mg by mouth daily as needed for heartburn or indigestion.    [provider]  gabapentin (NEURONTIN) 300 MG capsule Take 300 mg by mouth 3 (three) times daily.    [provider]  HYDROcodone-acetaminophen (NORCO) 7.5-325 MG tablet Take 1 tablet by mouth at bedtime.    [provider]  naproxen sodium (ALEVE) 220 MG tablet Take 440 mg by mouth 2 (two) times daily as needed (pain.).     [provider]  oxymetazoline (AFRIN) 0.05 % nasal spray Place 1 spray into both nostrils 2 (two) times daily as needed for  congestion.    [provider]  sitaGLIPtan-metformin (JANUMET) 50-1000 MG per tablet Take 1 tablet by mouth daily.     [provider]    Family History Family History  Problem Relation Age of Onset   Cancer Other    Hypertension Other    Diabetes Mother    Alzheimer's disease Mother    Diabetes Father     Social History Social History   Tobacco Use   Smoking status: Former    Years: 13.00    Types: Cigarettes    Quit date: 03/23/2002    Years since quitting: 19.1   Smokeless tobacco: Never  Vaping Use   Vaping Use: Never used  Substance Use Topics   Alcohol use: Yes     Alcohol/week: 2.0 standard drinks    Types: 2 Standard drinks or equivalent per week    Comment: mixed drinks   Drug use: No     Allergies   Penicillins and Shellfish allergy   Review of Systems Review of Systems  Constitutional:  Negative for fatigue and fever.  Eyes:  Negative for redness, itching and visual disturbance.  Respiratory:  Negative for shortness of breath.   Cardiovascular:  Negative for chest pain and leg swelling.  Gastrointestinal:  Negative for nausea and vomiting.  Musculoskeletal:  Positive for arthralgias. Negative for myalgias.  Skin:  Positive for color change. Negative for rash and wound.  Neurological:  Negative for dizziness, syncope, weakness, light-headedness and headaches.    Physical Exam Triage Vital Signs ED Triage Vitals  Enc Vitals Group     BP      Pulse      Resp      Temp      Temp src      SpO2      Weight      Height      Head Circumference      Peak Flow      Pain Score      Pain Loc      Pain Edu?      Excl. in [place]?    No data found.  Updated Vital Signs BP 100/69 (BP Location: Right Arm)   Pulse 97   Temp 98.1 F (36.7 C) (Oral)   Resp 20   SpO2 96%   Visual Acuity Right Eye Distance:   Left Eye Distance:   Bilateral Distance:    Right Eye Near:   Left Eye Near:    Bilateral Near:     Physical Exam Vitals and nursing note reviewed.  Constitutional:      Appearance: He is well-developed.     Comments: No acute distress  HENT:     Head: Normocephalic and atraumatic.     Nose: Nose normal.  Eyes:     Conjunctiva/sclera: Conjunctivae normal.  Cardiovascular:     Rate and Rhythm: Normal rate.  Pulmonary:     Effort: Pulmonary effort is normal. No respiratory distress.  Abdominal:     General: There is no distension.  Musculoskeletal:        General: Normal range of motion.     Cervical back: Neck supple.  Skin:    General: Skin is warm and dry.     Comments: Left thumb with yellow discoloration  noted to lateral nail fold with surrounding erythema and swelling, no discoloration noted to finger pad although slight tenderness present, full active range of motion at IP joint  Neurological:     Mental Status:  He is alert and oriented to person, place, and time.     UC Treatments / Results  Labs (all labs ordered are listed, but only abnormal results are displayed) Labs Reviewed - No data to display  EKG   Radiology No results found.  Procedures Procedures (including critical care time)  Medications Ordered in UC Medications - No data to display  Initial Impression / Assessment and Plan / UC Course  I have reviewed the triage vital signs and the nursing notes.  Pertinent labs & imaging results that were available during my care of the patient were reviewed by me and considered in my medical decision making (see chart for details).     Left thumb paronychia-treating with doxycycline, soaking, Bactroban topically and anti-inflammatories, tramadol for severe pain/nighttime pain for 2 days.  I&D attempted without significant drainage obtained, continue to monitor,Discussed strict return precautions. Patient verbalized understanding and is agreeable with plan.  Final Clinical Impressions(s) / UC Diagnoses   Final diagnoses:  Acute paronychia of left thumb     Discharge Instructions      Doxycycline twice daily for 1 week Continue to soak finger dry well and apply Bactroban/mupirocin ointment twice daily Tylenol and ibuprofen for mild to moderate pain Tramadol for severe pain/nighttime pain Please monitor for gradual resolution of pains and swelling, follow-up if not improving or worsening     ED Prescriptions     Medication Sig Dispense Auth. Provider   doxycycline (VIBRAMYCIN) 100 MG capsule Take 1 capsule (100 mg total) by mouth 2 (two) times daily for 7 days. 14 capsule Meghan Warshawsky C, PA-C   mupirocin ointment (BACTROBAN) 2 % Apply 1 application topically  2 (two) times daily. 30 g Baneen Wieseler C, PA-C   ibuprofen (ADVIL) 800 MG tablet Take 1 tablet (800 mg total) by mouth 3 (three) times daily. 21 tablet Kenyata Napier C, PA-C   traMADol (ULTRAM) 50 MG tablet Take 1 tablet (50 mg total) by mouth every 6 (six) hours as needed. 8 tablet Odalis Jordan, [place] C, PA-C      I have reviewed the PDMP during this encounter.   Janith Lima, [place] 05/23/21 1547

## 2021-05-23 NOTE — Discharge Instructions (Addendum)
Doxycycline twice daily for 1 week Continue to soak finger dry well and apply Bactroban/mupirocin ointment twice daily Tylenol and ibuprofen for mild to moderate pain Tramadol for severe pain/nighttime pain Please monitor for gradual resolution of pains and swelling, follow-up if not improving or worsening

## 2021-05-23 NOTE — ED Triage Notes (Signed)
Pt presents with c/o a hang nail on the left thumb. States it has started to turn green and and feels a throbbing sensation on his thumb.

## 2021-06-03 DIAGNOSIS — R059 Cough, unspecified: Secondary | ICD-10-CM | POA: Diagnosis not present

## 2021-06-03 DIAGNOSIS — Z20822 Contact with and (suspected) exposure to covid-19: Secondary | ICD-10-CM | POA: Diagnosis not present

## 2021-06-03 DIAGNOSIS — H9202 Otalgia, left ear: Secondary | ICD-10-CM | POA: Diagnosis not present

## 2021-06-03 DIAGNOSIS — B349 Viral infection, unspecified: Secondary | ICD-10-CM | POA: Diagnosis not present

## 2021-06-03 DIAGNOSIS — J029 Acute pharyngitis, unspecified: Secondary | ICD-10-CM | POA: Diagnosis not present

## 2021-06-04 DIAGNOSIS — Z1211 Encounter for screening for malignant neoplasm of colon: Secondary | ICD-10-CM | POA: Diagnosis not present

## 2021-06-04 DIAGNOSIS — R079 Chest pain, unspecified: Secondary | ICD-10-CM | POA: Diagnosis not present

## 2021-06-04 DIAGNOSIS — R1314 Dysphagia, pharyngoesophageal phase: Secondary | ICD-10-CM | POA: Diagnosis not present

## 2021-06-04 DIAGNOSIS — K219 Gastro-esophageal reflux disease without esophagitis: Secondary | ICD-10-CM | POA: Diagnosis not present

## 2021-08-16 DIAGNOSIS — E669 Obesity, unspecified: Secondary | ICD-10-CM | POA: Diagnosis not present

## 2021-08-16 DIAGNOSIS — Z6834 Body mass index (BMI) 34.0-34.9, adult: Secondary | ICD-10-CM | POA: Diagnosis not present

## 2021-08-16 DIAGNOSIS — E1140 Type 2 diabetes mellitus with diabetic neuropathy, unspecified: Secondary | ICD-10-CM | POA: Diagnosis not present

## 2021-08-16 DIAGNOSIS — Z23 Encounter for immunization: Secondary | ICD-10-CM | POA: Diagnosis not present

## 2021-08-16 DIAGNOSIS — E1165 Type 2 diabetes mellitus with hyperglycemia: Secondary | ICD-10-CM | POA: Diagnosis not present

## 2021-08-21 ENCOUNTER — Other Ambulatory Visit (HOSPITAL_BASED_OUTPATIENT_CLINIC_OR_DEPARTMENT_OTHER): Payer: Self-pay

## 2021-08-21 DIAGNOSIS — G4733 Obstructive sleep apnea (adult) (pediatric): Secondary | ICD-10-CM

## 2021-09-18 DIAGNOSIS — N521 Erectile dysfunction due to diseases classified elsewhere: Secondary | ICD-10-CM | POA: Diagnosis not present

## 2021-09-18 DIAGNOSIS — E785 Hyperlipidemia, unspecified: Secondary | ICD-10-CM | POA: Diagnosis not present

## 2021-09-18 DIAGNOSIS — G4733 Obstructive sleep apnea (adult) (pediatric): Secondary | ICD-10-CM | POA: Diagnosis not present

## 2021-09-18 DIAGNOSIS — E1165 Type 2 diabetes mellitus with hyperglycemia: Secondary | ICD-10-CM | POA: Diagnosis not present

## 2021-10-10 ENCOUNTER — Other Ambulatory Visit: Payer: Self-pay

## 2021-10-10 ENCOUNTER — Ambulatory Visit (HOSPITAL_BASED_OUTPATIENT_CLINIC_OR_DEPARTMENT_OTHER): Payer: 59 | Admitting: Internal Medicine

## 2021-10-17 ENCOUNTER — Ambulatory Visit (HOSPITAL_BASED_OUTPATIENT_CLINIC_OR_DEPARTMENT_OTHER): Payer: 59 | Attending: Internal Medicine | Admitting: Internal Medicine

## 2021-10-17 ENCOUNTER — Other Ambulatory Visit: Payer: Self-pay

## 2021-10-17 VITALS — Ht 73.0 in | Wt 258.6 lb

## 2021-10-17 DIAGNOSIS — G4733 Obstructive sleep apnea (adult) (pediatric): Secondary | ICD-10-CM

## 2021-10-18 DIAGNOSIS — N521 Erectile dysfunction due to diseases classified elsewhere: Secondary | ICD-10-CM | POA: Diagnosis not present

## 2021-10-18 DIAGNOSIS — E785 Hyperlipidemia, unspecified: Secondary | ICD-10-CM | POA: Diagnosis not present

## 2021-10-18 DIAGNOSIS — E1140 Type 2 diabetes mellitus with diabetic neuropathy, unspecified: Secondary | ICD-10-CM | POA: Diagnosis not present

## 2021-10-18 DIAGNOSIS — E1165 Type 2 diabetes mellitus with hyperglycemia: Secondary | ICD-10-CM | POA: Diagnosis not present

## 2021-10-23 ENCOUNTER — Ambulatory Visit (HOSPITAL_BASED_OUTPATIENT_CLINIC_OR_DEPARTMENT_OTHER): Payer: 59 | Attending: Internal Medicine | Admitting: Internal Medicine

## 2021-10-23 VITALS — Ht 73.0 in | Wt 258.0 lb

## 2021-11-15 DIAGNOSIS — M79641 Pain in right hand: Secondary | ICD-10-CM | POA: Diagnosis not present

## 2021-11-15 DIAGNOSIS — S60221A Contusion of right hand, initial encounter: Secondary | ICD-10-CM | POA: Diagnosis not present

## 2021-11-15 DIAGNOSIS — R200 Anesthesia of skin: Secondary | ICD-10-CM | POA: Diagnosis not present

## 2021-11-15 DIAGNOSIS — M79642 Pain in left hand: Secondary | ICD-10-CM | POA: Diagnosis not present

## 2021-11-29 ENCOUNTER — Other Ambulatory Visit (HOSPITAL_BASED_OUTPATIENT_CLINIC_OR_DEPARTMENT_OTHER): Payer: Self-pay

## 2021-11-29 DIAGNOSIS — G4733 Obstructive sleep apnea (adult) (pediatric): Secondary | ICD-10-CM

## 2022-01-04 DIAGNOSIS — M79672 Pain in left foot: Secondary | ICD-10-CM | POA: Diagnosis not present

## 2022-01-04 DIAGNOSIS — M21372 Foot drop, left foot: Secondary | ICD-10-CM | POA: Diagnosis not present

## 2022-01-08 DIAGNOSIS — E785 Hyperlipidemia, unspecified: Secondary | ICD-10-CM | POA: Diagnosis not present

## 2022-01-08 DIAGNOSIS — E1165 Type 2 diabetes mellitus with hyperglycemia: Secondary | ICD-10-CM | POA: Diagnosis not present

## 2022-01-14 DIAGNOSIS — E1165 Type 2 diabetes mellitus with hyperglycemia: Secondary | ICD-10-CM | POA: Diagnosis not present

## 2022-01-14 DIAGNOSIS — E1142 Type 2 diabetes mellitus with diabetic polyneuropathy: Secondary | ICD-10-CM | POA: Diagnosis not present

## 2022-01-14 DIAGNOSIS — I739 Peripheral vascular disease, unspecified: Secondary | ICD-10-CM | POA: Diagnosis not present

## 2022-01-14 DIAGNOSIS — G4733 Obstructive sleep apnea (adult) (pediatric): Secondary | ICD-10-CM | POA: Diagnosis not present

## 2022-01-14 DIAGNOSIS — E669 Obesity, unspecified: Secondary | ICD-10-CM | POA: Diagnosis not present

## 2022-01-14 DIAGNOSIS — E785 Hyperlipidemia, unspecified: Secondary | ICD-10-CM | POA: Diagnosis not present

## 2022-01-23 DIAGNOSIS — G629 Polyneuropathy, unspecified: Secondary | ICD-10-CM | POA: Diagnosis not present

## 2022-01-23 DIAGNOSIS — G5732 Lesion of lateral popliteal nerve, left lower limb: Secondary | ICD-10-CM | POA: Diagnosis not present

## 2022-02-13 DIAGNOSIS — D125 Benign neoplasm of sigmoid colon: Secondary | ICD-10-CM | POA: Diagnosis not present

## 2022-02-13 DIAGNOSIS — K648 Other hemorrhoids: Secondary | ICD-10-CM | POA: Diagnosis not present

## 2022-02-13 DIAGNOSIS — Z1211 Encounter for screening for malignant neoplasm of colon: Secondary | ICD-10-CM | POA: Diagnosis not present

## 2022-02-19 DIAGNOSIS — M21372 Foot drop, left foot: Secondary | ICD-10-CM | POA: Diagnosis not present

## 2022-02-19 DIAGNOSIS — M7672 Peroneal tendinitis, left leg: Secondary | ICD-10-CM | POA: Diagnosis not present

## 2022-02-28 DIAGNOSIS — M79662 Pain in left lower leg: Secondary | ICD-10-CM | POA: Diagnosis not present

## 2022-03-07 DIAGNOSIS — M25572 Pain in left ankle and joints of left foot: Secondary | ICD-10-CM | POA: Diagnosis not present

## 2022-03-07 DIAGNOSIS — M21372 Foot drop, left foot: Secondary | ICD-10-CM | POA: Diagnosis not present

## 2022-03-07 DIAGNOSIS — R2242 Localized swelling, mass and lump, left lower limb: Secondary | ICD-10-CM | POA: Diagnosis not present

## 2022-03-07 DIAGNOSIS — M5412 Radiculopathy, cervical region: Secondary | ICD-10-CM | POA: Diagnosis not present

## 2022-03-20 DIAGNOSIS — M25572 Pain in left ankle and joints of left foot: Secondary | ICD-10-CM | POA: Diagnosis not present

## 2022-03-20 DIAGNOSIS — M545 Low back pain, unspecified: Secondary | ICD-10-CM | POA: Diagnosis not present

## 2022-03-26 DIAGNOSIS — M21372 Foot drop, left foot: Secondary | ICD-10-CM | POA: Diagnosis not present

## 2022-03-26 DIAGNOSIS — M7672 Peroneal tendinitis, left leg: Secondary | ICD-10-CM | POA: Diagnosis not present

## 2022-03-28 ENCOUNTER — Encounter (HOSPITAL_BASED_OUTPATIENT_CLINIC_OR_DEPARTMENT_OTHER): Payer: 59 | Admitting: Internal Medicine

## 2022-04-16 DIAGNOSIS — M79671 Pain in right foot: Secondary | ICD-10-CM | POA: Diagnosis not present

## 2022-04-16 DIAGNOSIS — M79672 Pain in left foot: Secondary | ICD-10-CM | POA: Diagnosis not present

## 2022-04-24 ENCOUNTER — Ambulatory Visit (HOSPITAL_BASED_OUTPATIENT_CLINIC_OR_DEPARTMENT_OTHER): Payer: 59 | Attending: Internal Medicine | Admitting: Internal Medicine

## 2022-04-24 ENCOUNTER — Other Ambulatory Visit: Payer: Self-pay

## 2022-04-24 VITALS — Ht 73.0 in | Wt 268.0 lb

## 2022-04-24 DIAGNOSIS — G4733 Obstructive sleep apnea (adult) (pediatric): Secondary | ICD-10-CM | POA: Insufficient documentation

## 2022-04-25 ENCOUNTER — Encounter (HOSPITAL_BASED_OUTPATIENT_CLINIC_OR_DEPARTMENT_OTHER): Payer: 59 | Admitting: Internal Medicine

## 2022-04-26 DIAGNOSIS — M21372 Foot drop, left foot: Secondary | ICD-10-CM | POA: Diagnosis not present

## 2022-04-28 DIAGNOSIS — G4733 Obstructive sleep apnea (adult) (pediatric): Secondary | ICD-10-CM | POA: Diagnosis not present

## 2022-04-28 NOTE — Procedures (Signed)
Patient Name: Eric Galvan, Eric Galvan Date: 04/24/2022 Gender: Male D.O.B: 04/30/1966 Age (years): 55 Referring Provider: Latanya Presser MD Height (inches): 73 Interpreting Physician: Baird Lyons MD, ABSM Weight (lbs): 268 RPSGT: Laren Everts BMI: 35 MRN: [id number] Neck Size: 19.00  CLINICAL INFORMATION Sleep Study Type: Split Night CPAP Indication for sleep study: Diabetes, Obesity, OSA, Snoring Epworth Sleepiness Score: 7  SLEEP STUDY TECHNIQUE As per the AASM Manual for the Scoring of Sleep and Associated Events v2.3 (April 2016) with a hypopnea requiring 4% desaturations.  The channels recorded and monitored were frontal, central and occipital EEG, electrooculogram (EOG), submentalis EMG (chin), nasal and oral airflow, thoracic and abdominal wall motion, anterior tibialis EMG, snore microphone, electrocardiogram, and pulse oximetry. Continuous positive airway pressure (CPAP) was initiated when the patient met split night criteria and was titrated according to treat sleep-disordered breathing.  MEDICATIONS Medications self-administered by patient taken the night of the study : none reported  RESPIRATORY PARAMETERS Diagnostic  Total AHI (/hr): 54.8 RDI (/hr): 58.8 OA Index (/hr): 4 CA Index (/hr): 0.5 REM AHI (/hr): 85.7 NREM AHI (/hr): 53.9 Supine AHI (/hr): N/A Non-supine AHI (/hr): 54.8 Min O2 Sat (%): 82.0 Mean O2 (%): 93.2 Time below 88% (min): 3.6   Titration  Optimal Pressure (cm): 11 AHI at Optimal Pressure (/hr): 0 Min O2 at Optimal Pressure (%): 94.0 Supine % at Optimal (%): 0 Sleep % at Optimal (%): 98   SLEEP ARCHITECTURE The recording time for the entire night was 402.9 minutes.  During a baseline period of 184.3 minutes, the patient slept for 121.5 minutes in REM and nonREM, yielding a sleep efficiency of 65.9%%. Sleep onset after lights out was 21.8 minutes with a REM latency of 73.0 minutes. The patient spent 28.4%% of the night in stage N1  sleep, 68.7%% in stage N2 sleep, 0.0%% in stage N3 and 2.9% in REM.    During the titration period of 212.2 minutes, the patient slept for 180.0 minutes in REM and nonREM, yielding a sleep efficiency of 84.8%%. Sleep onset after CPAP initiation was 20.6 minutes with a REM latency of 21.5 minutes. The patient spent 8.6%% of the night in stage N1 sleep, 70.0%% in stage N2 sleep, 0.0%% in stage N3 and 21.4% in REM.  CARDIAC DATA The 2 lead EKG demonstrated sinus rhythm. The mean heart rate was 100.0 beats per minute. Other EKG findings include: None.  LEG MOVEMENT DATA The total Periodic Limb Movements of Sleep (PLMS) were 0. The PLMS index was 0.0 .  IMPRESSIONS - Severe obstructive sleep apnea occurred during the diagnostic portion of the study (AHI = 54.8/hour). An optimal PAP pressure was selected for this patient ( 11 cm of water) - No significant central sleep apnea occurred during the diagnostic portion of the study (CAI = 0.5/hour). - Mild oxygen desaturation was noted during the diagnostic portion of the study (Min O2 = 82.0%). On CPAP 11, minimum O2 saturation was 94% and mean 95.5%. - The patient snored with moderate snoring volume during the diagnostic portion of the study. - No cardiac abnormalities were noted during this study. - Clinically significant periodic limb movements did not occur during sleep.  DIAGNOSIS - Obstructive Sleep Apnea (G47.33)  RECOMMENDATIONS - Trial of CPAP therapy on 11 cm H2O or autopap 5-15. - Patient wore a Large size Fisher&Paykel Nasal Eson2 mask and heated humidification. - Be careful with alcohol, sedatives and other CNS depressants that may worsen sleep apnea and disrupt normal sleep architecture. - Sleep  hygiene should be reviewed to assess factors that may improve sleep quality. - Weight management and regular exercise should be initiated or continued.  [Electronically signed] 04/28/2022 12:21 PM  Baird Lyons MD, [place] [employer],  American Board of Sleep Medicine NPI: [id number]                         [place], [place] [facility]  ELECTRONICALLY SIGNED ON:  04/28/2022, 12:18 PM [place] PH: [phone]   FX: [phone] [place]

## 2022-05-07 DIAGNOSIS — M79671 Pain in right foot: Secondary | ICD-10-CM | POA: Diagnosis not present

## 2022-05-07 DIAGNOSIS — M21372 Foot drop, left foot: Secondary | ICD-10-CM | POA: Diagnosis not present

## 2022-05-07 DIAGNOSIS — R2689 Other abnormalities of gait and mobility: Secondary | ICD-10-CM | POA: Diagnosis not present

## 2022-05-07 DIAGNOSIS — M79672 Pain in left foot: Secondary | ICD-10-CM | POA: Diagnosis not present

## 2022-05-07 DIAGNOSIS — R200 Anesthesia of skin: Secondary | ICD-10-CM | POA: Diagnosis not present

## 2022-05-08 DIAGNOSIS — R200 Anesthesia of skin: Secondary | ICD-10-CM | POA: Diagnosis not present

## 2022-05-08 DIAGNOSIS — R2689 Other abnormalities of gait and mobility: Secondary | ICD-10-CM | POA: Diagnosis not present

## 2022-05-08 DIAGNOSIS — E1142 Type 2 diabetes mellitus with diabetic polyneuropathy: Secondary | ICD-10-CM | POA: Diagnosis not present

## 2022-05-08 DIAGNOSIS — M21372 Foot drop, left foot: Secondary | ICD-10-CM | POA: Diagnosis not present

## 2022-05-13 DIAGNOSIS — M79672 Pain in left foot: Secondary | ICD-10-CM | POA: Diagnosis not present

## 2022-05-13 DIAGNOSIS — M79671 Pain in right foot: Secondary | ICD-10-CM | POA: Diagnosis not present

## 2022-05-14 DIAGNOSIS — R200 Anesthesia of skin: Secondary | ICD-10-CM | POA: Diagnosis not present

## 2022-05-14 DIAGNOSIS — M21372 Foot drop, left foot: Secondary | ICD-10-CM | POA: Diagnosis not present

## 2022-05-14 DIAGNOSIS — R2689 Other abnormalities of gait and mobility: Secondary | ICD-10-CM | POA: Diagnosis not present

## 2022-05-21 DIAGNOSIS — M48061 Spinal stenosis, lumbar region without neurogenic claudication: Secondary | ICD-10-CM | POA: Diagnosis not present

## 2022-05-21 DIAGNOSIS — M47816 Spondylosis without myelopathy or radiculopathy, lumbar region: Secondary | ICD-10-CM | POA: Diagnosis not present

## 2022-05-21 DIAGNOSIS — M5126 Other intervertebral disc displacement, lumbar region: Secondary | ICD-10-CM | POA: Diagnosis not present

## 2022-05-28 DIAGNOSIS — R221 Localized swelling, mass and lump, neck: Secondary | ICD-10-CM | POA: Diagnosis not present

## 2022-05-29 ENCOUNTER — Other Ambulatory Visit: Payer: Self-pay | Admitting: Otolaryngology

## 2022-05-29 DIAGNOSIS — D170 Benign lipomatous neoplasm of skin and subcutaneous tissue of head, face and neck: Secondary | ICD-10-CM

## 2022-06-05 DIAGNOSIS — M21372 Foot drop, left foot: Secondary | ICD-10-CM | POA: Diagnosis not present

## 2022-06-06 DIAGNOSIS — M21372 Foot drop, left foot: Secondary | ICD-10-CM | POA: Diagnosis not present

## 2022-06-06 DIAGNOSIS — M79661 Pain in right lower leg: Secondary | ICD-10-CM | POA: Diagnosis not present

## 2022-06-06 DIAGNOSIS — M79662 Pain in left lower leg: Secondary | ICD-10-CM | POA: Diagnosis not present

## 2022-06-06 DIAGNOSIS — M25651 Stiffness of right hip, not elsewhere classified: Secondary | ICD-10-CM | POA: Diagnosis not present

## 2022-06-11 DIAGNOSIS — E785 Hyperlipidemia, unspecified: Secondary | ICD-10-CM | POA: Diagnosis not present

## 2022-06-11 DIAGNOSIS — E1165 Type 2 diabetes mellitus with hyperglycemia: Secondary | ICD-10-CM | POA: Diagnosis not present

## 2022-06-12 DIAGNOSIS — Z23 Encounter for immunization: Secondary | ICD-10-CM | POA: Diagnosis not present

## 2022-06-12 DIAGNOSIS — E1165 Type 2 diabetes mellitus with hyperglycemia: Secondary | ICD-10-CM | POA: Diagnosis not present

## 2022-06-12 DIAGNOSIS — N521 Erectile dysfunction due to diseases classified elsewhere: Secondary | ICD-10-CM | POA: Diagnosis not present

## 2022-06-12 DIAGNOSIS — Z0001 Encounter for general adult medical examination with abnormal findings: Secondary | ICD-10-CM | POA: Diagnosis not present

## 2022-06-12 DIAGNOSIS — G4733 Obstructive sleep apnea (adult) (pediatric): Secondary | ICD-10-CM | POA: Diagnosis not present

## 2022-06-12 DIAGNOSIS — Z1211 Encounter for screening for malignant neoplasm of colon: Secondary | ICD-10-CM | POA: Diagnosis not present

## 2022-06-12 DIAGNOSIS — E669 Obesity, unspecified: Secondary | ICD-10-CM | POA: Diagnosis not present

## 2022-06-12 DIAGNOSIS — Z125 Encounter for screening for malignant neoplasm of prostate: Secondary | ICD-10-CM | POA: Diagnosis not present

## 2022-06-18 DIAGNOSIS — H524 Presbyopia: Secondary | ICD-10-CM | POA: Diagnosis not present

## 2022-06-18 DIAGNOSIS — H25813 Combined forms of age-related cataract, bilateral: Secondary | ICD-10-CM | POA: Diagnosis not present

## 2022-06-18 DIAGNOSIS — H40013 Open angle with borderline findings, low risk, bilateral: Secondary | ICD-10-CM | POA: Diagnosis not present

## 2022-06-18 DIAGNOSIS — E119 Type 2 diabetes mellitus without complications: Secondary | ICD-10-CM | POA: Diagnosis not present

## 2022-06-19 DIAGNOSIS — E1142 Type 2 diabetes mellitus with diabetic polyneuropathy: Secondary | ICD-10-CM | POA: Diagnosis not present

## 2022-07-01 DIAGNOSIS — M47816 Spondylosis without myelopathy or radiculopathy, lumbar region: Secondary | ICD-10-CM | POA: Diagnosis not present

## 2022-07-01 DIAGNOSIS — M5136 Other intervertebral disc degeneration, lumbar region: Secondary | ICD-10-CM | POA: Diagnosis not present

## 2022-07-01 DIAGNOSIS — M21372 Foot drop, left foot: Secondary | ICD-10-CM | POA: Diagnosis not present

## 2022-07-24 DIAGNOSIS — M6281 Muscle weakness (generalized): Secondary | ICD-10-CM | POA: Diagnosis not present

## 2022-07-24 DIAGNOSIS — E1142 Type 2 diabetes mellitus with diabetic polyneuropathy: Secondary | ICD-10-CM | POA: Diagnosis not present

## 2022-07-24 DIAGNOSIS — R29898 Other symptoms and signs involving the musculoskeletal system: Secondary | ICD-10-CM | POA: Diagnosis not present

## 2022-07-24 DIAGNOSIS — M21372 Foot drop, left foot: Secondary | ICD-10-CM | POA: Diagnosis not present

## 2022-08-06 DIAGNOSIS — E1142 Type 2 diabetes mellitus with diabetic polyneuropathy: Secondary | ICD-10-CM | POA: Diagnosis not present

## 2022-08-06 DIAGNOSIS — R29898 Other symptoms and signs involving the musculoskeletal system: Secondary | ICD-10-CM | POA: Diagnosis not present

## 2022-08-06 DIAGNOSIS — M6281 Muscle weakness (generalized): Secondary | ICD-10-CM | POA: Diagnosis not present

## 2022-08-06 DIAGNOSIS — M21372 Foot drop, left foot: Secondary | ICD-10-CM | POA: Diagnosis not present

## 2022-08-13 DIAGNOSIS — E1142 Type 2 diabetes mellitus with diabetic polyneuropathy: Secondary | ICD-10-CM | POA: Diagnosis not present

## 2022-08-13 DIAGNOSIS — R29898 Other symptoms and signs involving the musculoskeletal system: Secondary | ICD-10-CM | POA: Diagnosis not present

## 2022-08-13 DIAGNOSIS — M6281 Muscle weakness (generalized): Secondary | ICD-10-CM | POA: Diagnosis not present

## 2022-08-13 DIAGNOSIS — M21372 Foot drop, left foot: Secondary | ICD-10-CM | POA: Diagnosis not present

## 2022-08-15 DIAGNOSIS — E1142 Type 2 diabetes mellitus with diabetic polyneuropathy: Secondary | ICD-10-CM | POA: Diagnosis not present

## 2022-08-15 DIAGNOSIS — M21372 Foot drop, left foot: Secondary | ICD-10-CM | POA: Diagnosis not present

## 2022-08-15 DIAGNOSIS — R29898 Other symptoms and signs involving the musculoskeletal system: Secondary | ICD-10-CM | POA: Diagnosis not present

## 2022-08-15 DIAGNOSIS — M6281 Muscle weakness (generalized): Secondary | ICD-10-CM | POA: Diagnosis not present

## 2022-08-19 DIAGNOSIS — E1142 Type 2 diabetes mellitus with diabetic polyneuropathy: Secondary | ICD-10-CM | POA: Diagnosis not present

## 2022-08-19 DIAGNOSIS — R29898 Other symptoms and signs involving the musculoskeletal system: Secondary | ICD-10-CM | POA: Diagnosis not present

## 2022-08-19 DIAGNOSIS — M6281 Muscle weakness (generalized): Secondary | ICD-10-CM | POA: Diagnosis not present

## 2022-08-19 DIAGNOSIS — M21372 Foot drop, left foot: Secondary | ICD-10-CM | POA: Diagnosis not present

## 2022-08-29 DIAGNOSIS — Z125 Encounter for screening for malignant neoplasm of prostate: Secondary | ICD-10-CM | POA: Diagnosis not present

## 2022-09-03 DIAGNOSIS — E1165 Type 2 diabetes mellitus with hyperglycemia: Secondary | ICD-10-CM | POA: Diagnosis not present

## 2022-09-03 DIAGNOSIS — E669 Obesity, unspecified: Secondary | ICD-10-CM | POA: Diagnosis not present

## 2022-09-03 DIAGNOSIS — E1140 Type 2 diabetes mellitus with diabetic neuropathy, unspecified: Secondary | ICD-10-CM | POA: Diagnosis not present

## 2022-09-03 DIAGNOSIS — R978 Other abnormal tumor markers: Secondary | ICD-10-CM | POA: Diagnosis not present

## 2022-09-15 ENCOUNTER — Ambulatory Visit
Admission: RE | Admit: 2022-09-15 | Discharge: 2022-09-15 | Disposition: A | Discharge status: Home / Self Care | Payer: BC Managed Care – PPO | Source: Ambulatory Visit | Attending: Urgent Care | Admitting: Urgent Care

## 2022-09-15 ENCOUNTER — Ambulatory Visit (INDEPENDENT_AMBULATORY_CARE_PROVIDER_SITE_OTHER): Payer: 59

## 2022-09-15 VITALS — BP 131/80 | HR 104 | Temp 99.4°F | Resp 20

## 2022-09-15 DIAGNOSIS — E119 Type 2 diabetes mellitus without complications: Secondary | ICD-10-CM | POA: Diagnosis not present

## 2022-09-15 DIAGNOSIS — R0989 Other specified symptoms and signs involving the circulatory and respiratory systems: Secondary | ICD-10-CM | POA: Diagnosis not present

## 2022-09-15 DIAGNOSIS — R059 Cough, unspecified: Secondary | ICD-10-CM

## 2022-09-15 DIAGNOSIS — J018 Other acute sinusitis: Secondary | ICD-10-CM

## 2022-09-15 DIAGNOSIS — R053 Chronic cough: Secondary | ICD-10-CM | POA: Diagnosis not present

## 2022-09-15 MED ORDER — PROMETHAZINE-DM 6.25-15 MG/5ML PO SYRP: 2.5000 mL | ORAL_SOLUTION | Freq: Three times a day (TID) | ORAL | 0 refills | Status: DC | PRN

## 2022-09-15 MED ORDER — CETIRIZINE HCL 10 MG PO TABS: 10.0000 mg | ORAL_TABLET | Freq: Every day | ORAL | 0 refills | Status: DC

## 2022-09-15 MED ORDER — DOXYCYCLINE HYCLATE 100 MG PO CAPS: 100.0000 mg | ORAL_CAPSULE | Freq: Two times a day (BID) | ORAL | 0 refills | Status: DC

## 2022-09-15 NOTE — ED Triage Notes (Signed)
Pt c/o nasal and chest congestion with prod cough-sx started ~2 weeks ago-worse x 3 days-NAD-steady gait

## 2022-09-15 NOTE — ED Provider Notes (Signed)
[facility]  Note:  This document was prepared using Systems analyst and may include unintentional dictation errors.  MRN: [id number] DOB: 06-Jul-1966  Subjective:   Eric Galvan is a 56 y.o. male presenting for 2 to 3-week history of acute onset persistent productive cough, sinus congestion, chest congestion, sinus pain, intermittent bilateral alternating ear pain, sinus drainage, throat discomfort.  No history of asthma.  No smoking, vaping, marijuana use.  Patient is a type II diabetic treated without insulin.  No current facility-administered medications for this encounter.  Current Outpatient Medications:    MOUNJARO 12.5 MG/0.5ML Pen, SMARTSIG:12.5 Milligram(s) SUB-Q Once a Week PRN, Disp: , Rfl:    acetaminophen (TYLENOL) 500 MG tablet, Take 1,000 mg by mouth every 6 (six) hours as needed (for pain.)., Disp: , Rfl:    atorvastatin (LIPITOR) 80 MG tablet, 1 tab(s) orally once a day (at bedtime) for 90 days, Disp: , Rfl:    Exenatide ER (BYDUREON) 2 MG PEN, Inject 2 mg into the skin every "Sunday., Disp: , Rfl:    famotidine (PEPCID) 20 MG tablet, Take 20 mg by mouth daily as needed for heartburn or indigestion., Disp: , Rfl:    gabapentin (NEURONTIN) 300 MG capsule, Take 300 mg by mouth 3 (three) times daily., Disp: , Rfl:    HYDROcodone-acetaminophen (NORCO) 7.5-325 MG tablet, Take 1 tablet by mouth at bedtime., Disp: , Rfl:    ibuprofen (ADVIL) 800 MG tablet, Take 1 tablet (800 mg total) by mouth 3 (three) times daily., Disp: 21 tablet, Rfl: 0   mupirocin ointment (BACTROBAN) 2 %, Apply 1 application topically 2 (two) times daily., Disp: 30 g, Rfl: 0   naproxen sodium (ALEVE) 220 MG tablet, Take 440 mg by mouth 2 (two) times daily as needed (pain.). , Disp: , Rfl:    oxymetazoline (AFRIN) 0.05 % nasal spray, Place 1 spray into both nostrils 2 (two) times daily as needed for congestion., Disp: , Rfl:    sitaGLIPtan-metformin (JANUMET)  50-1000 MG per tablet, Take 1 tablet by mouth daily. , Disp: , Rfl:    traMADol (ULTRAM) 50 MG tablet, Take 1 tablet (50 mg total) by mouth every 6 (six) hours as needed., Disp: 8 tablet, Rfl: 0   Allergies  Allergen Reactions   Penicillins Anaphylaxis and Itching    Did it involve swelling of the face/tongue/throat, SOB, or low BP? Yes Did it involve sudden or severe rash/hives, skin peeling, or any reaction on the inside of your mouth or nose? No Did you need to seek medical attention at a hospital or doctor's office? Yes When did it last happen?      20"$  year ago If all above answers are "NO", may proceed with cephalosporin use.    Shellfish Allergy Shortness Of Breath and Swelling    Past Medical History:  Diagnosis Date   Allergy    Diabetes mellitus    type 2   GERD (gastroesophageal reflux disease)    diet controlled   Hyperlipidemia    diet controlled, no meds   Sleep apnea    uses CPAP nightly     Past Surgical History:  Procedure Laterality Date   cluster headaches     patient states resolved on 11/15/19   COLONOSCOPY     ESOPHAGEAL MANOMETRY N/A 07/08/2018   Procedure: ESOPHAGEAL MANOMETRY (EM);  Surgeon: Wilford Corner, MD;  Location: [facility];  Service: Endoscopy;  Laterality: N/A;   [place] IMPEDANCE STUDY N/A 07/08/2018  Procedure: [place] IMPEDANCE STUDY;  Surgeon: Wilford Corner, MD;  Location: [facility];  Service: Endoscopy;  Laterality: N/A;   RADIOLOGY WITH ANESTHESIA N/A 11/16/2019   Procedure: MRI WITH ANESTHESIA CERVICAL SPINE WITH OUT CONTRAST;  Surgeon: Radiologist, Medication, MD;  Location: [facility];  Service: Radiology;  Laterality: N/A;    Family History  Problem Relation Age of Onset   Cancer Other    Hypertension Other    Diabetes Mother    Alzheimer's disease Mother    Diabetes Father     Social History   Tobacco Use   Smoking status: Former    Years: 13.00    Types: Cigarettes    Quit date: 03/23/2002    Years since quitting: 20.4    Smokeless tobacco: Never  Vaping Use   Vaping Use: Never used  Substance Use Topics   Alcohol use: Yes    Alcohol/week: 2.0 standard drinks of alcohol    Types: 2 Standard drinks or equivalent per week    Comment: occ   Drug use: No    ROS   Objective:   Vitals: BP 131/80 (BP Location: Left Arm)   Pulse (!) 104   Temp 99.4 F (37.4 C) (Oral)   Resp 20   SpO2 95%   Physical Exam Constitutional:      General: He is not in acute distress.    Appearance: Normal appearance. He is well-developed and normal weight. He is not ill-appearing, toxic-appearing or diaphoretic.  HENT:     Head: Normocephalic and atraumatic.     Right Ear: Tympanic membrane, ear canal and external ear normal. No drainage, swelling or tenderness. No middle ear effusion. There is no impacted cerumen. Tympanic membrane is not erythematous or bulging.     Left Ear: Tympanic membrane, ear canal and external ear normal. No drainage, swelling or tenderness.  No middle ear effusion. There is no impacted cerumen. Tympanic membrane is not erythematous or bulging.     Nose: Congestion and rhinorrhea present.     Mouth/Throat:     Mouth: Mucous membranes are moist.     Pharynx: No oropharyngeal exudate or posterior oropharyngeal erythema.  Eyes:     General: No scleral icterus.       Right eye: No discharge.        Left eye: No discharge.     Extraocular Movements: Extraocular movements intact.     Conjunctiva/sclera: Conjunctivae normal.  Cardiovascular:     Rate and Rhythm: Normal rate and regular rhythm.     Heart sounds: Normal heart sounds. No murmur heard.    No friction rub. No gallop.  Pulmonary:     Effort: Pulmonary effort is normal. No respiratory distress.     Breath sounds: Normal breath sounds. No stridor. No wheezing, rhonchi or rales.  Musculoskeletal:     Cervical back: Normal range of motion and neck supple. No rigidity. No muscular tenderness.  Neurological:     General: No focal deficit  present.     Mental Status: He is alert and oriented to person, place, and time.  Psychiatric:        Mood and Affect: Mood normal.        Behavior: Behavior normal.        Thought Content: Thought content normal.     DG Chest 2 View  Result Date: 09/15/2022 CLINICAL DATA:  Chest congestion cough EXAM: CHEST - 2 VIEW COMPARISON:  05/17/2019 FINDINGS: Minimal scarring or atelectasis at the left base. No  acute consolidation, pleural effusion or pneumothorax. Normal cardiomediastinal silhouette. IMPRESSION: No active cardiopulmonary disease. Minimal scarring or atelectasis at the left base. Electronically Signed   By: Donavan Foil M.D.   On: 09/15/2022 15:07     Assessment and Plan :   PDMP not reviewed this encounter.  1. Other acute sinusitis, recurrence not specified   2. Persistent cough   3. Type 2 diabetes mellitus treated without insulin (HCC)     Will start empiric treatment for sinusitis with doxycycline given his allergy to penicillins.  Recommended supportive care otherwise including the use of oral antihistamine, cough suppression medication.  Chest x-ray negative.  Given timeline of illness will defer respiratory testing.  Counseled patient on potential for adverse effects with medications prescribed/recommended today, ER and return-to-clinic precautions discussed, patient verbalized understanding.    Jaynee Eagles, [place] 09/15/22 [id number]

## 2022-09-26 DIAGNOSIS — G4733 Obstructive sleep apnea (adult) (pediatric): Secondary | ICD-10-CM | POA: Diagnosis not present

## 2022-09-27 DIAGNOSIS — E785 Hyperlipidemia, unspecified: Secondary | ICD-10-CM | POA: Diagnosis not present

## 2022-09-27 DIAGNOSIS — E1165 Type 2 diabetes mellitus with hyperglycemia: Secondary | ICD-10-CM | POA: Diagnosis not present

## 2022-09-27 DIAGNOSIS — R978 Other abnormal tumor markers: Secondary | ICD-10-CM | POA: Diagnosis not present

## 2022-10-08 DIAGNOSIS — N5201 Erectile dysfunction due to arterial insufficiency: Secondary | ICD-10-CM | POA: Diagnosis not present

## 2022-10-08 DIAGNOSIS — R972 Elevated prostate specific antigen [PSA]: Secondary | ICD-10-CM | POA: Diagnosis not present

## 2022-10-09 DIAGNOSIS — E1142 Type 2 diabetes mellitus with diabetic polyneuropathy: Secondary | ICD-10-CM | POA: Diagnosis not present

## 2022-10-22 ENCOUNTER — Other Ambulatory Visit (HOSPITAL_BASED_OUTPATIENT_CLINIC_OR_DEPARTMENT_OTHER): Payer: Self-pay

## 2022-10-22 MED ORDER — MOUNJARO 15 MG/0.5ML ~~LOC~~ SOAJ
15.0000 mg | SUBCUTANEOUS | 2 refills | Status: DC
  Filled 2022-10-22: qty 2, 28d supply, fill #0
  Filled 2022-11-18: qty 2, 28d supply, fill #1
  Filled 2022-12-14 – 2022-12-21 (×2): qty 2, 28d supply, fill #2

## 2022-10-27 DIAGNOSIS — G4733 Obstructive sleep apnea (adult) (pediatric): Secondary | ICD-10-CM | POA: Diagnosis not present

## 2022-10-29 ENCOUNTER — Ambulatory Visit: Payer: 59 | Admitting: Skilled Nursing Facility1

## 2022-11-12 DIAGNOSIS — R972 Elevated prostate specific antigen [PSA]: Secondary | ICD-10-CM | POA: Diagnosis not present

## 2022-11-13 ENCOUNTER — Encounter: Payer: Self-pay | Admitting: Skilled Nursing Facility1

## 2022-11-13 ENCOUNTER — Encounter: Payer: BC Managed Care – PPO | Attending: Internal Medicine | Admitting: Skilled Nursing Facility1

## 2022-11-13 VITALS — Ht 73.0 in | Wt 275.7 lb

## 2022-11-13 DIAGNOSIS — E119 Type 2 diabetes mellitus without complications: Secondary | ICD-10-CM | POA: Insufficient documentation

## 2022-11-13 NOTE — Progress Notes (Signed)
DM Medications: Monjaro 15 mg   Other Rx: Rosuvastatin Pepcid Aleive once a week   Other Dx: GERD Hyperlipidemia   A1C 6.9  Pt states he does have Neuropathy and foot drop. Pt states he has good support from friends and family. Pt states he doe snot check his blood sugars stating he does not like needles and has not taken the time.  Pt states he likes beans and non starchy vegetables. Pt states he does make a conscious effort with his meals.  Pt states his job is very stressful.   Goals: Ask your insurance if they cover POGO Eat breakfast and lunch Create balanced meals from home such as soup, salad, or whole wheat chicken salad sandwich Reduce how often a week you eat out Increase your physical activity per week such as the [facility] or [employer] pool  Diabetes Self-Management Education  Visit Type: First/Initial    11/13/2022  Eric Galvan, identified by name and date of birth, is a 57 y.o. male with a diagnosis of Diabetes: Type 2.   ASSESSMENT  Height '6\' 1"'$  (1.854 m), weight 275 lb 11.2 oz (125.1 kg). Body mass index is 36.37 kg/m.   Diabetes Self-Management Education - 11/13/22 1124       Visit Information   Visit Type First/Initial      Initial Visit   Diabetes Type Type 2    Are you currently following a meal plan? No    Are you taking your medications as prescribed? Yes      Health Coping   How would you rate your overall health? Fair      Psychosocial Assessment   Patient Belief/Attitude about Diabetes Afraid    What is the hardest part about your diabetes right now, causing you the most concern, or is the most worrisome to you about your diabetes?   Making healty food and beverage choices    Self-care barriers None    Self-management support Friends;Family    Patient Concerns Nutrition/Meal planning    Special Needs None    Preferred Learning Style Visual;Auditory    Learning Readiness Contemplating    How often do you need to have someone help  you when you read instructions, pamphlets, or other written materials from your doctor or pharmacy? 1 - Never      Pre-Education Assessment   Patient understands the diabetes disease and treatment process. Needs Instruction    Patient understands incorporating nutritional management into lifestyle. Needs Instruction    Patient undertands incorporating physical activity into lifestyle. Needs Instruction    Patient understands using medications safely. Needs Instruction    Patient understands monitoring blood glucose, interpreting and using results Needs Instruction    Patient understands prevention, detection, and treatment of acute complications. Needs Instruction    Patient understands prevention, detection, and treatment of chronic complications. Needs Instruction    Patient understands how to develop strategies to address psychosocial issues. Needs Instruction    Patient understands how to develop strategies to promote health/change behavior. Needs Instruction      Complications   Last HgB A1C per patient/outside source 6.9 %    How often do you check your blood sugar? 0 times/day (not testing)    Have you had a dilated eye exam in the past 12 months? No    Have you had a dental exam in the past 12 months? Yes    Are you checking your feet? No      Dietary Intake   Breakfast  skipped    Lunch fast food or eaten out    [employer] or beef or salmon and broccoli or green beans or cabbage or brussles + french fries    Snack (evening) dessert    Beverage(s) sweet tea, (66 ounces) water, soda once a week, liquor      Activity / Exercise   Activity / Exercise Type ADL's    How many days per week do you exercise? 0    How many minutes per day do you exercise? 0    Total minutes per week of exercise 0      Patient Education   Previous Diabetes Education No    Disease Pathophysiology Factors that contribute to the development of diabetes    Healthy Eating Role of diet in the treatment  of diabetes and the relationship between the three main macronutrients and blood glucose level;Food label reading, portion sizes and measuring food.;Plate Method;Reviewed blood glucose goals for pre and post meals and how to evaluate the patients' food intake on their blood glucose level.;Information on hints to eating out and maintain blood glucose control.    Being Active Role of exercise on diabetes management, blood pressure control and cardiac health.;Helped patient identify appropriate exercises in relation to his/her diabetes, diabetes complications and other health issue.    Medications Reviewed patients medication for diabetes, action, purpose, timing of dose and side effects.    Monitoring Taught/evaluated SMBG meter.;Interpreting lab values - A1C, lipid, urine microalbumina.;Daily foot exams;Yearly dilated eye exam    Acute complications Discussed and identified patients' prevention, symptoms, and treatment of hyperglycemia.;Taught prevention, symptoms, and  treatment of hypoglycemia - the 15 rule.    Chronic complications Relationship between chronic complications and blood glucose control;Retinopathy and reason for yearly dilated eye exams;Nephropathy, what it is, prevention of, the use of ACE, ARB's and early detection of through urine microalbumia.;Dental care    Diabetes Stress and Support Worked with patient to identify barriers to care and solutions;Role of stress on diabetes;Identified and addressed patients feelings and concerns about diabetes      Individualized Goals (developed by patient)   Nutrition General guidelines for healthy choices and portions discussed;Follow meal plan discussed    Physical Activity Exercise 3-5 times per week;45 minutes per day    Medications take my medication as prescribed    Monitoring  Test blood glucose pre and post meals as discussed    Problem Solving Eating Pattern      Post-Education Assessment   Patient understands the diabetes disease and  treatment process. Demonstrates understanding / competency    Patient understands incorporating nutritional management into lifestyle. Demonstrates understanding / competency    Patient undertands incorporating physical activity into lifestyle. Demonstrates understanding / competency    Patient understands using medications safely. Demonstrates understanding / competency    Patient understands monitoring blood glucose, interpreting and using results Demonstrates understanding / competency    Patient understands prevention, detection, and treatment of acute complications. Demonstrates understanding / competency    Patient understands prevention, detection, and treatment of chronic complications. Demonstrates understanding / competency    Patient understands how to develop strategies to address psychosocial issues. Demonstrates understanding / competency    Patient understands how to develop strategies to promote health/change behavior. Demonstrates understanding / competency      Outcomes   Future DMSE PRN    Program Status Completed             Individualized Plan for Diabetes Self-Management Training:  Learning Objective:  Patient will have a greater understanding of diabetes self-management. Patient education plan is to attend individual and/or group sessions per assessed needs and concerns.    Expected Outcomes:     Education material provided: ADA - How to Thrive: A Guide for Your Journey with Diabetes, Food label handouts, My Plate, Snack sheet, and Carbohydrate counting sheet  If problems or questions, patient to contact team via:  Phone and Email  Future DSME appointment: PRN

## 2022-11-23 ENCOUNTER — Other Ambulatory Visit (HOSPITAL_BASED_OUTPATIENT_CLINIC_OR_DEPARTMENT_OTHER): Payer: Self-pay

## 2022-11-26 ENCOUNTER — Other Ambulatory Visit (HOSPITAL_BASED_OUTPATIENT_CLINIC_OR_DEPARTMENT_OTHER): Payer: Self-pay

## 2022-11-26 DIAGNOSIS — K219 Gastro-esophageal reflux disease without esophagitis: Secondary | ICD-10-CM | POA: Diagnosis not present

## 2022-11-26 DIAGNOSIS — E1165 Type 2 diabetes mellitus with hyperglycemia: Secondary | ICD-10-CM | POA: Diagnosis not present

## 2022-11-26 DIAGNOSIS — E1140 Type 2 diabetes mellitus with diabetic neuropathy, unspecified: Secondary | ICD-10-CM | POA: Diagnosis not present

## 2022-11-26 DIAGNOSIS — E785 Hyperlipidemia, unspecified: Secondary | ICD-10-CM | POA: Diagnosis not present

## 2022-11-26 MED ORDER — MOUNJARO 15 MG/0.5ML ~~LOC~~ SOAJ
SUBCUTANEOUS | 2 refills | Status: DC
  Filled 2022-11-26 – 2023-02-04 (×4): qty 2, 28d supply, fill #0
  Filled 2023-03-07: qty 2, 28d supply, fill #1
  Filled 2023-04-03 – 2023-04-11 (×2): qty 2, 28d supply, fill #2

## 2022-11-27 DIAGNOSIS — G4733 Obstructive sleep apnea (adult) (pediatric): Secondary | ICD-10-CM | POA: Diagnosis not present

## 2022-12-14 ENCOUNTER — Other Ambulatory Visit (HOSPITAL_BASED_OUTPATIENT_CLINIC_OR_DEPARTMENT_OTHER): Payer: Self-pay

## 2022-12-16 ENCOUNTER — Other Ambulatory Visit (HOSPITAL_BASED_OUTPATIENT_CLINIC_OR_DEPARTMENT_OTHER): Payer: Self-pay

## 2022-12-21 ENCOUNTER — Other Ambulatory Visit (HOSPITAL_BASED_OUTPATIENT_CLINIC_OR_DEPARTMENT_OTHER): Payer: Self-pay

## 2022-12-23 ENCOUNTER — Other Ambulatory Visit (HOSPITAL_BASED_OUTPATIENT_CLINIC_OR_DEPARTMENT_OTHER): Payer: Self-pay

## 2022-12-26 ENCOUNTER — Other Ambulatory Visit (HOSPITAL_BASED_OUTPATIENT_CLINIC_OR_DEPARTMENT_OTHER): Payer: Self-pay

## 2022-12-26 DIAGNOSIS — G4733 Obstructive sleep apnea (adult) (pediatric): Secondary | ICD-10-CM | POA: Diagnosis not present

## 2022-12-27 ENCOUNTER — Other Ambulatory Visit (HOSPITAL_BASED_OUTPATIENT_CLINIC_OR_DEPARTMENT_OTHER): Payer: Self-pay

## 2022-12-30 ENCOUNTER — Other Ambulatory Visit (HOSPITAL_BASED_OUTPATIENT_CLINIC_OR_DEPARTMENT_OTHER): Payer: Self-pay

## 2022-12-30 ENCOUNTER — Other Ambulatory Visit: Payer: Self-pay

## 2023-01-02 DIAGNOSIS — E1165 Type 2 diabetes mellitus with hyperglycemia: Secondary | ICD-10-CM | POA: Diagnosis not present

## 2023-01-09 DIAGNOSIS — R972 Elevated prostate specific antigen [PSA]: Secondary | ICD-10-CM | POA: Diagnosis not present

## 2023-01-26 DIAGNOSIS — G4733 Obstructive sleep apnea (adult) (pediatric): Secondary | ICD-10-CM | POA: Diagnosis not present

## 2023-01-28 ENCOUNTER — Other Ambulatory Visit (HOSPITAL_BASED_OUTPATIENT_CLINIC_OR_DEPARTMENT_OTHER): Payer: Self-pay

## 2023-02-04 ENCOUNTER — Other Ambulatory Visit (HOSPITAL_BASED_OUTPATIENT_CLINIC_OR_DEPARTMENT_OTHER): Payer: Self-pay

## 2023-02-25 DIAGNOSIS — G4733 Obstructive sleep apnea (adult) (pediatric): Secondary | ICD-10-CM | POA: Diagnosis not present

## 2023-03-03 DIAGNOSIS — C61 Malignant neoplasm of prostate: Secondary | ICD-10-CM | POA: Diagnosis not present

## 2023-03-03 DIAGNOSIS — D075 Carcinoma in situ of prostate: Secondary | ICD-10-CM | POA: Diagnosis not present

## 2023-03-03 DIAGNOSIS — N4289 Other specified disorders of prostate: Secondary | ICD-10-CM | POA: Diagnosis not present

## 2023-03-05 ENCOUNTER — Other Ambulatory Visit: Payer: Self-pay

## 2023-03-05 DIAGNOSIS — Z794 Long term (current) use of insulin: Secondary | ICD-10-CM | POA: Insufficient documentation

## 2023-03-05 DIAGNOSIS — E119 Type 2 diabetes mellitus without complications: Secondary | ICD-10-CM | POA: Insufficient documentation

## 2023-03-05 DIAGNOSIS — X58XXXA Exposure to other specified factors, initial encounter: Secondary | ICD-10-CM | POA: Diagnosis not present

## 2023-03-05 DIAGNOSIS — T161XXA Foreign body in right ear, initial encounter: Secondary | ICD-10-CM | POA: Diagnosis not present

## 2023-03-05 DIAGNOSIS — Z7984 Long term (current) use of oral hypoglycemic drugs: Secondary | ICD-10-CM | POA: Insufficient documentation

## 2023-03-05 NOTE — ED Triage Notes (Signed)
Q-tip end got stuck in right ear. No pain. Reduced hearing.

## 2023-03-06 ENCOUNTER — Emergency Department (HOSPITAL_BASED_OUTPATIENT_CLINIC_OR_DEPARTMENT_OTHER)
Admission: EM | Admit: 2023-03-06 | Discharge: 2023-03-06 | Disposition: A | Discharge status: Home / Self Care | Payer: BC Managed Care – PPO | Attending: Emergency Medicine | Admitting: Emergency Medicine

## 2023-03-06 DIAGNOSIS — T161XXA Foreign body in right ear, initial encounter: Secondary | ICD-10-CM

## 2023-03-06 MED ORDER — LIDOCAINE HCL 2 % IJ SOLN
5.0000 mL | Freq: Once | INTRAMUSCULAR | Status: AC
  Administered 2023-03-06: 100 mg
  Filled 2023-03-06: qty 20

## 2023-03-06 NOTE — ED Provider Notes (Signed)
[place] EMERGENCY DEPARTMENT AT [facility] Provider Note   CSN: [id number] Arrival date & time: 03/05/23  2227     History  Chief Complaint  Patient presents with   Foreign Body in Ear    Eric Galvan is a 57 y.o. male.  With PMH of DM 2, HLD who presents with foreign body of soft Q-tip end stuck in right ear.  Patient was using a Q-tip in right ear when he lost the soft edge in his right ear.  He was unable to get it out.  He has some discomfort in his right ear and decreased hearing.  Otherwise no other complaints.   Foreign Body in Ear       Home Medications Prior to Admission medications   Medication Sig Start Date End Date Taking? Authorizing Provider  acetaminophen (TYLENOL) 500 MG tablet Take 1,000 mg by mouth every 6 (six) hours as needed (for pain.).    [provider]  atorvastatin (LIPITOR) 80 MG tablet 1 tab(s) orally once a day (at bedtime) for 90 days    [provider]  cetirizine (ZYRTEC ALLERGY) 10 MG tablet Take 1 tablet (10 mg total) by mouth daily. 09/15/22   Wallis Bamberg, PA-C  doxycycline (VIBRAMYCIN) 100 MG capsule Take 1 capsule (100 mg total) by mouth 2 (two) times daily. 09/15/22   Wallis Bamberg, PA-C  Exenatide ER (BYDUREON) 2 MG PEN Inject 2 mg into the skin every Sunday.    [provider]  famotidine (PEPCID) 20 MG tablet Take 20 mg by mouth daily as needed for heartburn or indigestion.    [provider]  gabapentin (NEURONTIN) 300 MG capsule Take 300 mg by mouth 3 (three) times daily.    [provider]  HYDROcodone-acetaminophen (NORCO) 7.5-325 MG tablet Take 1 tablet by mouth at bedtime.    [provider]  ibuprofen (ADVIL) 800 MG tablet Take 1 tablet (800 mg total) by mouth 3 (three) times daily. 05/23/21   Wieters, Hallie C, PA-C  MOUNJARO 12.5 MG/0.5ML Pen SMARTSIG:12.5 Milligram(s) SUB-Q Once a Week PRN 06/17/22   [provider]  mupirocin ointment (BACTROBAN) 2 %  Apply 1 application topically 2 (two) times daily. 05/23/21   Wieters, Hallie C, PA-C  naproxen sodium (ALEVE) 220 MG tablet Take 440 mg by mouth 2 (two) times daily as needed (pain.).     [provider]  oxymetazoline (AFRIN) 0.05 % nasal spray Place 1 spray into both nostrils 2 (two) times daily as needed for congestion.    [provider]  promethazine-dextromethorphan (PROMETHAZINE-DM) 6.25-15 MG/5ML syrup Take 2.5 mLs by mouth 3 (three) times daily as needed for cough. 09/15/22   Wallis Bamberg, PA-C  sitaGLIPtan-metformin (JANUMET) 50-1000 MG per tablet Take 1 tablet by mouth daily.     [provider]  tirzepatide Greggory Keen) 15 MG/0.5ML Pen Inject 15 mg into the skin once a week. 10/22/22     tirzepatide (MOUNJARO) 15 MG/0.5ML Pen Inject 15 mg every week by subcutaneous route. 11/26/22     traMADol (ULTRAM) 50 MG tablet Take 1 tablet (50 mg total) by mouth every 6 (six) hours as needed. 05/23/21   Wieters, Hallie C, PA-C      Allergies    Penicillins and Shellfish allergy    Review of Systems   Review of Systems  Physical Exam Updated Vital Signs BP 123/79   Pulse 82   Temp 98.2 F (36.8 C)   Resp 18   SpO2 99%  Physical Exam Constitutional: Alert and oriented. Well appearing and in no distress. Eyes: Conjunctivae are normal. ENT      Ears: Left ear nontender nonerythematous normal TM no erythema or bulging.  Right ear nontender nonerythematous.  External auditory canal of right ear nonerythematous and nontender with no drainage with fixed foreign body Q-tip end on TM Cardiovascular: Mildly tachycardic Respiratory: Normal respiratory effort. Musculoskeletal: Normal range of motion in all extremities. Neurologic: Normal speech and language. No gross focal neurologic deficits are appreciated. Skin: Skin is warm, dry and intact. No rash noted. Psychiatric: Mood and affect are normal. Speech and behavior are normal.  ED Results / Procedures / Treatments    Labs (all labs ordered are listed, but only abnormal results are displayed) Labs Reviewed - No data to display  EKG None  Radiology No results found.  Procedures .Foreign Body Removal  Date/Time: 03/06/2023 3:32 AM  Performed by: Mardene Sayer, MD Authorized by: Mardene Sayer, MD  Consent: Verbal consent obtained. Consent given by: patient Patient understanding: patient states understanding of the procedure being performed Patient identity confirmed: verbally with patient Body area: ear Location details: right ear Anesthesia: local infiltration  Anesthesia: Local Anesthetic: lidocaine 2% without epinephrine  Sedation: Patient sedated: no  Patient restrained: no Localization method: visualized Removal mechanism: suction Complexity: simple 1 objects recovered. Objects recovered: Q-tip end Post-procedure assessment: foreign body removed      Medications Ordered in ED Medications  lidocaine (XYLOCAINE) 2 % (with pres) injection 100 mg (100 mg Other Given 03/06/23 0240)    ED Course/ Medical Decision Making/ A&P                             Medical Decision Making FINTON QUINCEY is a 57 y.o. male.  With PMH of DM 2, HLD who presents with foreign body of soft Q-tip end stuck in right ear.   Q-tip end/foreign body successfully removed from right ear with suction.  Revisualized TM after removal with no evidence of perforation.  No evidence of residual FB. no erythema or abrasions from procedure.  No need for Ciprodex drops.  Patient discharged in good condition.  Risk Prescription drug management.      Final Clinical Impression(s) / ED Diagnoses Final diagnoses:  Foreign body of right ear, initial encounter    Rx / DC Orders ED Discharge Orders     None         Mardene Sayer, MD 03/06/23 [phone]

## 2023-03-06 NOTE — Discharge Instructions (Signed)
Come back if any severe ear pain, redness, discharge or any other concerning symptoms suggestive of infection.

## 2023-03-06 NOTE — ED Notes (Signed)
Reviewed AVS with patient, patient expressed understanding of directions, denies further questions at this time. 

## 2023-03-06 NOTE — ED Notes (Signed)
MD at bedside for foreign body removal.

## 2023-03-07 ENCOUNTER — Other Ambulatory Visit (HOSPITAL_BASED_OUTPATIENT_CLINIC_OR_DEPARTMENT_OTHER): Payer: Self-pay

## 2023-03-10 DIAGNOSIS — C61 Malignant neoplasm of prostate: Secondary | ICD-10-CM | POA: Diagnosis not present

## 2023-03-13 ENCOUNTER — Other Ambulatory Visit (HOSPITAL_BASED_OUTPATIENT_CLINIC_OR_DEPARTMENT_OTHER): Payer: Self-pay

## 2023-03-20 DIAGNOSIS — C61 Malignant neoplasm of prostate: Secondary | ICD-10-CM | POA: Diagnosis not present

## 2023-03-20 DIAGNOSIS — Z0001 Encounter for general adult medical examination with abnormal findings: Secondary | ICD-10-CM | POA: Diagnosis not present

## 2023-03-20 DIAGNOSIS — E785 Hyperlipidemia, unspecified: Secondary | ICD-10-CM | POA: Diagnosis not present

## 2023-03-21 ENCOUNTER — Other Ambulatory Visit (HOSPITAL_COMMUNITY): Payer: Self-pay | Admitting: Urology

## 2023-03-21 DIAGNOSIS — C61 Malignant neoplasm of prostate: Secondary | ICD-10-CM

## 2023-03-28 DIAGNOSIS — G4733 Obstructive sleep apnea (adult) (pediatric): Secondary | ICD-10-CM | POA: Diagnosis not present

## 2023-04-03 ENCOUNTER — Other Ambulatory Visit (HOSPITAL_BASED_OUTPATIENT_CLINIC_OR_DEPARTMENT_OTHER): Payer: Self-pay

## 2023-04-07 ENCOUNTER — Encounter (HOSPITAL_COMMUNITY)
Admission: RE | Admit: 2023-04-07 | Discharge: 2023-04-07 | Disposition: A | Discharge status: Home / Self Care | Payer: BC Managed Care – PPO | Source: Ambulatory Visit | Attending: Urology | Admitting: Urology

## 2023-04-07 DIAGNOSIS — C61 Malignant neoplasm of prostate: Secondary | ICD-10-CM | POA: Insufficient documentation

## 2023-04-07 MED ORDER — PIFLIFOLASTAT F 18 (PYLARIFY) INJECTION
9.0000 | Freq: Once | INTRAVENOUS | Status: AC
  Administered 2023-04-07: 9.92 via INTRAVENOUS

## 2023-04-08 DIAGNOSIS — C61 Malignant neoplasm of prostate: Secondary | ICD-10-CM | POA: Diagnosis not present

## 2023-04-10 ENCOUNTER — Other Ambulatory Visit: Payer: Self-pay | Admitting: Urology

## 2023-04-10 DIAGNOSIS — R221 Localized swelling, mass and lump, neck: Secondary | ICD-10-CM

## 2023-04-14 ENCOUNTER — Other Ambulatory Visit (HOSPITAL_BASED_OUTPATIENT_CLINIC_OR_DEPARTMENT_OTHER): Payer: Self-pay

## 2023-04-14 ENCOUNTER — Encounter (HOSPITAL_BASED_OUTPATIENT_CLINIC_OR_DEPARTMENT_OTHER): Payer: Self-pay

## 2023-04-15 ENCOUNTER — Other Ambulatory Visit: Payer: Self-pay | Admitting: Urology

## 2023-04-15 DIAGNOSIS — C61 Malignant neoplasm of prostate: Secondary | ICD-10-CM | POA: Diagnosis not present

## 2023-04-15 DIAGNOSIS — M6281 Muscle weakness (generalized): Secondary | ICD-10-CM | POA: Diagnosis not present

## 2023-04-16 ENCOUNTER — Other Ambulatory Visit (HOSPITAL_BASED_OUTPATIENT_CLINIC_OR_DEPARTMENT_OTHER): Payer: Self-pay

## 2023-04-16 MED ORDER — MOUNJARO 12.5 MG/0.5ML ~~LOC~~ SOAJ
12.5000 mg | SUBCUTANEOUS | 0 refills | Status: DC
  Filled 2023-04-16: qty 2, 28d supply, fill #0

## 2023-04-17 NOTE — Progress Notes (Signed)
GU Location of Tumor / Histology: Prostate Ca  If Prostate Cancer, Gleason Score is (4 + 5) and PSA is (6.48 on 11/12/2022)  Biopsies      04/07/2023 Dr. Berniece Salines NM PET (PSMA) Skull to Mid Thigh CLINICAL DATA:  New diagnosis of prostate cancer PSA of 6.5. No surgery or therapy.  IMPRESSION: 1. Left-sided prostatic primary without tracer avid metastatic disease. 2. Tracer affinity within the left side of the neck is not felt to represent metastatic disease. Considerations include physiologic cervical ganglia activity versus an incidental CT occult lesion. Potential clinical strategies include attention on follow-up PET versus more complete characterization with postcontrast neck CT. 3.  Aortic Atherosclerosis (ICD10-I70.0)   Past/Anticipated interventions by urology, if any: NA  Past/Anticipated interventions by medical oncology, if any: NA  Weight changes, if any: No  IPSS:  2 SHIM:  7  Bowel/Bladder complaints, if any:  Constipation  Nausea/Vomiting, if any: No  Pain issues, if any:  0/10  SAFETY ISSUES: Prior radiation? No Pacemaker/ICD? No Possible current pregnancy? Male Is the patient on methotrexate? No  Current Complaints / other details:

## 2023-04-21 ENCOUNTER — Other Ambulatory Visit: Payer: Self-pay

## 2023-04-21 ENCOUNTER — Ambulatory Visit
Admission: RE | Admit: 2023-04-21 | Discharge: 2023-04-21 | Disposition: A | Discharge status: Home / Self Care | Payer: BC Managed Care – PPO | Source: Ambulatory Visit | Attending: Radiation Oncology | Admitting: Radiation Oncology

## 2023-04-21 ENCOUNTER — Ambulatory Visit
Admission: RE | Admit: 2023-04-21 | Discharge: 2023-04-21 | Disposition: A | Discharge status: Home / Self Care | Payer: BC Managed Care – PPO | Source: Ambulatory Visit | Attending: Urology | Admitting: Urology

## 2023-04-21 ENCOUNTER — Encounter: Payer: Self-pay | Admitting: Radiation Oncology

## 2023-04-21 VITALS — BP 121/71 | HR 89 | Temp 96.8°F | Resp 20 | Ht 73.0 in | Wt 278.2 lb

## 2023-04-21 DIAGNOSIS — C61 Malignant neoplasm of prostate: Secondary | ICD-10-CM

## 2023-04-21 DIAGNOSIS — D170 Benign lipomatous neoplasm of skin and subcutaneous tissue of head, face and neck: Secondary | ICD-10-CM | POA: Diagnosis not present

## 2023-04-21 DIAGNOSIS — Z191 Hormone sensitive malignancy status: Secondary | ICD-10-CM | POA: Diagnosis not present

## 2023-04-21 DIAGNOSIS — R221 Localized swelling, mass and lump, neck: Secondary | ICD-10-CM

## 2023-04-21 MED ORDER — IOPAMIDOL (ISOVUE-300) INJECTION 61%
75.0000 mL | Freq: Once | INTRAVENOUS | Status: AC | PRN
  Administered 2023-04-21: 75 mL via INTRAVENOUS

## 2023-04-21 NOTE — Progress Notes (Signed)
Introduced myself to the patient as the prostate nurse navigator.  No barriers to care identified at this time.  He is here to discuss his radiation treatment options, and has confirmed he would like to proceed with surgery.  RN provided prostate support group information, and provided my contact for any future needs.

## 2023-04-21 NOTE — Progress Notes (Signed)
Radiation Oncology         [phone] ________________________________  Initial Outpatient Consultation  Name: Eric Galvan MRN: [id number]  Date: 04/21/2023  DOB: April 22, 1966  CC:[place], MD  Heloise Purpura, MD   REFERRING PHYSICIAN: Heloise Purpura, MD  DIAGNOSIS: 57 y.o. gentleman with Stage T1c adenocarcinoma of the prostate with Gleason score of 4+5, and PSA of 6.5.    ICD-10-CM   1. Malignant neoplasm of prostate (HCC)  C61       HISTORY OF PRESENT ILLNESS: Eric Galvan is a 57 y.o. male with a diagnosis of prostate cancer. He was noted to have an elevated PSA of 4.5 by his primary care physician, Dr. Leanor Kail. A repeat 4 weeks later showed a further rise to 5.6. Accordingly, he was referred for evaluation in urology by Dr. Marlou Porch on 10/08/22. He returned for repeat PSA on 11/12/22 showing further elevation to 6.5. The patient proceeded to transrectal ultrasound with 12 biopsies of the prostate on 03/03/23.  The prostate volume measured 40.3 cc.  Out of 12 core biopsies, 4 were positive.  The maximum Gleason score was 4+5, and this was seen in left mid lateral and left apex lateral. Additionally, Gleason 4+3 was seen in left base, and a small focus of Gleason 3+3 in right base lateral.  He underwent staging PSMA PET scan on 04/07/23 showing: left-sided prostatic primary without tracer-avid metastatic disease; tracer affinity within left side of neck, not felt to represent metastatic disease. He did undergo neck CT earlier today to further evaluate the area in the left neck. Results are pending.  The patient reviewed the biopsy results with his urologist and he has kindly been referred today for discussion of potential radiation treatment options. He was seen by Dr. Laverle Patter on 04/08/23 to discuss his surgical treatment options and is currently scheduled for prostatectomy on 05/08/23.   PREVIOUS RADIATION THERAPY: No  PAST MEDICAL HISTORY:  Past Medical History:   Diagnosis Date   Allergy    Diabetes mellitus    type 2   GERD (gastroesophageal reflux disease)    diet controlled   Hyperlipidemia    diet controlled, no meds   Sleep apnea    uses CPAP nightly      PAST SURGICAL HISTORY: Past Surgical History:  Procedure Laterality Date   cluster headaches     patient states resolved on 11/15/19   COLONOSCOPY     ESOPHAGEAL MANOMETRY N/A 07/08/2018   Procedure: ESOPHAGEAL MANOMETRY (EM);  Surgeon: Charlott Rakes, MD;  Location: [facility] ENDOSCOPY;  Service: Endoscopy;  Laterality: N/A;   PH IMPEDANCE STUDY N/A 07/08/2018   Procedure: PH IMPEDANCE STUDY;  Surgeon: Charlott Rakes, MD;  Location: [facility] ENDOSCOPY;  Service: Endoscopy;  Laterality: N/A;   RADIOLOGY WITH ANESTHESIA N/A 11/16/2019   Procedure: MRI WITH ANESTHESIA CERVICAL SPINE WITH OUT CONTRAST;  Surgeon: Radiologist, Medication, MD;  Location: [facility] OR;  Service: Radiology;  Laterality: N/A;    FAMILY HISTORY:  Family History  Problem Relation Age of Onset   Cancer Other    Hypertension Other    Diabetes Mother    Alzheimer's disease Mother    Diabetes Father     SOCIAL HISTORY:  Social History   Socioeconomic History   Marital status: Married    Spouse name: Not on file   Number of children: 2   Years of education: Not on file   Highest education level: Not on file  Occupational History   Occupation: Warehouse manager  Comment: [employer]  Tobacco Use   Smoking status: Former    Years: 13    Types: Cigarettes    Quit date: 03/23/2002    Years since quitting: 21.0   Smokeless tobacco: Never  Vaping Use   Vaping Use: Never used  Substance and Sexual Activity   Alcohol use: Yes    Alcohol/week: 2.0 standard drinks of alcohol    Types: 2 Standard drinks or equivalent per week    Comment: occ   Drug use: No   Sexual activity: Not on file  Other Topics Concern   Not on file  Social History Narrative   Lives with his wife and one of their two children.   Social  Determinants of Health   Financial Resource Strain: Not on file  Food Insecurity: Not on file  Transportation Needs: Not on file  Physical Activity: Not on file  Stress: Not on file  Social Connections: Not on file  Intimate Partner Violence: Not on file    ALLERGIES: Penicillins, Shellfish allergy, and Dust mite extract  MEDICATIONS:  Current Outpatient Medications  Medication Sig Dispense Refill   acetaminophen (TYLENOL) 500 MG tablet Take 1,000 mg by mouth every 6 (six) hours as needed (for pain.).     atorvastatin (LIPITOR) 80 MG tablet 1 tab(s) orally once a day (at bedtime) for 90 days     cetirizine (ZYRTEC ALLERGY) 10 MG tablet Take 1 tablet (10 mg total) by mouth daily. 30 tablet 0   doxycycline (VIBRAMYCIN) 100 MG capsule Take 1 capsule (100 mg total) by mouth 2 (two) times daily. 20 capsule 0   Exenatide ER (BYDUREON) 2 MG PEN Inject 2 mg into the skin every Sunday.     famotidine (PEPCID) 20 MG tablet Take 20 mg by mouth daily as needed for heartburn or indigestion.     gabapentin (NEURONTIN) 300 MG capsule Take 300 mg by mouth 3 (three) times daily.     HYDROcodone-acetaminophen (NORCO) 7.5-325 MG tablet Take 1 tablet by mouth at bedtime.     ibuprofen (ADVIL) 800 MG tablet Take 1 tablet (800 mg total) by mouth 3 (three) times daily. 21 tablet 0   MOUNJARO 12.5 MG/0.5ML Pen SMARTSIG:12.5 Milligram(s) SUB-Q Once a Week PRN     mupirocin ointment (BACTROBAN) 2 % Apply 1 application topically 2 (two) times daily. 30 g 0   naproxen sodium (ALEVE) 220 MG tablet Take 440 mg by mouth 2 (two) times daily as needed (pain.).      oxymetazoline (AFRIN) 0.05 % nasal spray Place 1 spray into both nostrils 2 (two) times daily as needed for congestion.     promethazine-dextromethorphan (PROMETHAZINE-DM) 6.25-15 MG/5ML syrup Take 2.5 mLs by mouth 3 (three) times daily as needed for cough. 100 mL 0   sitaGLIPtan-metformin (JANUMET) 50-1000 MG per tablet Take 1 tablet by mouth daily.       tirzepatide (MOUNJARO) 12.5 MG/0.5ML Pen Inject 12.5 mg into the skin once a week. 2 mL 0   tirzepatide (MOUNJARO) 15 MG/0.5ML Pen Inject 15 mg into the skin once a week. 2 mL 2   tirzepatide (MOUNJARO) 15 MG/0.5ML Pen Inject 15 mg every week by subcutaneous route. 2 mL 2   traMADol (ULTRAM) 50 MG tablet Take 1 tablet (50 mg total) by mouth every 6 (six) hours as needed. 8 tablet 0   No current facility-administered medications for this encounter.    REVIEW OF SYSTEMS:  On review of systems, the patient reports that he is doing  well overall. He denies any chest pain, shortness of breath, cough, fevers, chills, night sweats, unintended weight changes. He denies any bowel disturbances, and denies abdominal pain, nausea or vomiting. He denies any new musculoskeletal or joint aches or pains. His IPSS was 2, indicating mild urinary symptoms. His SHIM was 7, indicating he does have severe erectile dysfunction. A complete review of systems is obtained and is otherwise negative.    PHYSICAL EXAM:  Wt Readings from Last 3 Encounters:  11/13/22 275 lb 11.2 oz (125.1 kg)  04/24/22 268 lb (121.6 kg)  10/23/21 258 lb (117 kg)   Temp Readings from Last 3 Encounters:  03/05/23 98.2 F (36.8 C)  09/15/22 99.4 F (37.4 C) (Oral)  05/23/21 98.1 F (36.7 C) (Oral)   BP Readings from Last 3 Encounters:  03/06/23 123/79  09/15/22 131/80  05/23/21 100/69   Pulse Readings from Last 3 Encounters:  03/06/23 82  09/15/22 (!) 104  05/23/21 97    /10  In general this is a well appearing male in no acute distress. He's alert and oriented x4 and appropriate throughout the examination. Cardiopulmonary assessment is negative for acute distress, and he exhibits normal effort.     KPS = 100  100 - Normal; no complaints; no evidence of disease. 90   - Able to carry on normal activity; minor signs or symptoms of disease. 80   - Normal activity with effort; some signs or symptoms of disease. 35   - Cares for  self; unable to carry on normal activity or to do active work. 60   - Requires occasional assistance, but is able to care for most of his personal needs. 50   - Requires considerable assistance and frequent medical care. 40   - Disabled; requires special care and assistance. 30   - Severely disabled; hospital admission is indicated although death not imminent. 20   - Very sick; hospital admission necessary; active supportive treatment necessary. 10   - Moribund; fatal processes progressing rapidly. 0     - Dead  Karnofsky DA, Abelmann WH, Craver LS and Burchenal [facility] [phone]) The use of the nitrogen mustards in the palliative treatment of carcinoma: with particular reference to bronchogenic carcinoma Cancer 1 634-56  LABORATORY DATA:  Lab Results  Component Value Date   WBC 5.7 11/16/2019   HGB 13.4 11/16/2019   HCT 41.2 11/16/2019   MCV 87.8 11/16/2019   PLT 360 11/16/2019   Lab Results  Component Value Date   NA 138 11/16/2019   K 4.1 11/16/2019   CL 103 11/16/2019   CO2 24 11/16/2019   No results found for: "ALT", "AST", "GGT", "ALKPHOS", "BILITOT"   RADIOGRAPHY: NM PET (PSMA) SKULL TO MID THIGH  Result Date: 04/07/2023 CLINICAL DATA:  New diagnosis of prostate cancer PSA of 6.5. No surgery or therapy. EXAM: NUCLEAR MEDICINE PET SKULL BASE TO THIGH TECHNIQUE: 9.9 mCi F18 Piflufolastat (Pylarify) was injected intravenously. Full-ring PET imaging was performed from the skull base to thigh after the radiotracer. CT data was obtained and used for attenuation correction and anatomic localization. COMPARISON:  None Available. FINDINGS: NECK A focus of left-sided cervical tracer affinity just medial to the common carotid artery and jugular vein is without well-defined CT correlate. Measures a S.U.V. max of 5.8 on image 58/4. Incidental CT finding: No cervical adenopathy. Lipoma superficial the right parotid including at 3.0 cm on 50/4. Left carotid atherosclerosis. CHEST No radiotracer  accumulation within mediastinal or hilar lymph nodes. No suspicious  pulmonary nodules on the CT scan. Incidental CT finding: Mild cardiomegaly. Aortic atherosclerosis. Upper normal size periesophageal node of 7 mm on 121/4 is likely reactive. ABDOMEN/PELVIS Prostate: Lateral left mid to apical tracer affinity at a S.U.V. max of 15.3. Lymph nodes: No abnormal radiotracer accumulation within pelvic or abdominal nodes. Liver: No evidence of liver metastasis. Incidental CT finding: Normal adrenal glands. No renal calculi or hydronephrosis. No abdominopelvic adenopathy. SKELETON No focal activity to suggest skeletal metastasis. IMPRESSION: 1. Left-sided prostatic primary without tracer avid metastatic disease. 2. Tracer affinity within the left side of the neck is not felt to represent metastatic disease. Considerations include physiologic cervical ganglia activity versus an incidental CT occult lesion. Potential clinical strategies include attention on follow-up PET versus more complete characterization with postcontrast neck CT. 3.  Aortic Atherosclerosis (ICD10-I70.0). Electronically Signed   By: Jeronimo Greaves M.D.   On: 04/07/2023 16:23      IMPRESSION/PLAN: 1. 57 y.o. gentleman with Stage T1c adenocarcinoma of the prostate with Gleason Score of 4+5, and PSA of 6.5. We discussed the patient's workup and outlined the nature of prostate cancer in this setting. The patient's T stage, Gleason's score, and PSA put him into the high risk group. Accordingly, he is eligible for a variety of potential treatment options including LT-ADT concurrent with 8 weeks of external radiation, 5 weeks of external radiation with an upfront brachytherapy boost, or prostatectomy. We discussed the available radiation techniques, and focused on the details and logistics of delivery.  We also detailed the role of ADT in the treatment of high risk prostate cancer and outlined the associated side effects that could be expected with this  therapy. We discussed and outlined the risks, benefits, short and long-term effects associated with radiotherapy and compared and contrasted these with prostatectomy.   The patient focused most of his questions and interest in robotic-assisted laparoscopic radical prostatectomy.  We discussed some of the potential advantages of surgery including surgical staging, the availability of salvage radiotherapy to the prostatic fossa, and the confidence associated with immediate biochemical response. We discussed some of the potential proven indications for postoperative radiotherapy including positive margins, extracapsular extension, and seminal vesicle involvement. We also talked about some of the other potential findings leading to a recommendation for radiotherapy including a non-zero postoperative PSA and positive lymph nodes. He appears to have a good understanding of his disease and our treatment recommendations which are of curative intent.  He was encouraged to ask questions that were answered to his stated satisfaction.  At the conclusion of our conversation, the patient is interested in moving forward with with prostatectomy. We enjoyed meeting with him today, and we will look forward to following his progress.   We personally spent 60 minutes in this encounter including chart review, reviewing radiological studies, meeting face-to-face with the patient, entering orders and completing documentation.     Joyice Faster, PA-C    Margaretmary Dys, MD  [facility]  Radiation Oncology Direct Dial: [phone]  Fax: [phone] [email or web address]  Skype  [email or web address]   This document serves as a record of services personally performed by Margaretmary Dys, MD and Joyice Faster, PA-C. It was created on their behalf by Mickie Bail, a trained medical scribe. The creation of this record is based on the scribe's personal observations and the provider's statements to them. This document has been checked and  approved by the attending provider.

## 2023-04-25 NOTE — Progress Notes (Signed)
COVID Vaccine Completed: yes  Date of COVID positive in last 90 days:  PCP - Jamison Oka, [place] Cardiologist - Verdis Prime, MD LOV 08/19/18 for CP- no f/u needed  Chest x-ray - 09/15/22 Epic EKG -  Stress Test - 11/03/15 Epic ECHO -  Cardiac Cath -  Pacemaker/ICD device last checked: Spinal Cord Stimulator:  Bowel Prep -   Sleep Study -  CPAP -   Fasting Blood Sugar -  Checks Blood Sugar _____ times a day  Last dose of GLP1 agonist-  N/A GLP1 instructions:  N/A   Last dose of SGLT-2 inhibitors-  N/A SGLT-2 instructions: N/A   Blood Thinner Instructions:  Time Aspirin Instructions: ASA 81 Last Dose:  Activity level:  Can go up a flight of stairs and perform activities of daily living without stopping and without symptoms of chest pain or shortness of breath.  Able to exercise without symptoms  Unable to go up a flight of stairs without symptoms of     Anesthesia review:   Patient denies shortness of breath, fever, cough and chest pain at PAT appointment  Patient verbalized understanding of instructions that were given to them at the PAT appointment. Patient was also instructed that they will need to review over the PAT instructions again at home before surgery.

## 2023-04-25 NOTE — Patient Instructions (Signed)
SURGICAL WAITING ROOM VISITATION  Patients having surgery or a procedure may have no more than 2 support people in the waiting area - these visitors may rotate.    Children under the age of 27 must have an adult with them who is not the patient.  Due to an increase in RSV and influenza rates and associated hospitalizations, children ages 33 and under may not visit patients in [facility] hospitals.  If the patient needs to stay at the hospital during part of their recovery, the visitor guidelines for inpatient rooms apply. Pre-op nurse will coordinate an appropriate time for 1 support person to accompany patient in pre-op.  This support person may not rotate.    Please refer to the [facility] website for the visitor guidelines for Inpatients (after your surgery is over and you are in a regular room).    Your procedure is scheduled on: 05/08/23   Report to [facility] Main Entrance    Report to admitting at 5:15 AM   Call this number if you have problems the morning of surgery [phone]   Follow a clear liquid diet after surgery.  Water Non-Citrus Juices (without pulp, NO RED-Apple, White grape, White cranberry) Black Coffee (NO MILK/CREAM OR CREAMERS, sugar ok)  Clear Tea (NO MILK/CREAM OR CREAMERS, sugar ok) regular and decaf                             Plain Jell-O (NO RED)                                           Fruit ices (not with fruit pulp, NO RED)                                     Popsicles (NO RED)                                                               Sports drinks like Gatorade (NO RED)              Nothing to drink after midnight.          If you have questions, please contact your surgeon's office.   FOLLOW BOWEL PREP AND ANY ADDITIONAL PRE OP INSTRUCTIONS YOU RECEIVED FROM YOUR SURGEON'S OFFICE!!!     Oral Hygiene is also important to reduce your risk of infection.                                    Remember - BRUSH YOUR TEETH THE MORNING  OF SURGERY WITH YOUR REGULAR TOOTHPASTE  DENTURES WILL BE REMOVED PRIOR TO SURGERY PLEASE DO NOT APPLY "Poly grip" OR ADHESIVES!!!   Take these medicines the morning of surgery with A SIP OF WATER: Rosuvastatin   How to Manage Your Diabetes Before and After Surgery  Why is it important to control my blood sugar before and after surgery? Improving blood sugar levels before and after surgery helps healing  and can limit problems. A way of improving blood sugar control is eating a healthy diet by:  Eating less sugar and carbohydrates  Increasing activity/exercise  Talking with your doctor about reaching your blood sugar goals High blood sugars (greater than 180 mg/dL) can raise your risk of infections and slow your recovery, so you will need to focus on controlling your diabetes during the weeks before surgery. Make sure that the doctor who takes care of your diabetes knows about your planned surgery including the date and location.  How do I manage my blood sugar before surgery? Check your blood sugar at least 4 times a day, starting 2 days before surgery, to make sure that the level is not too high or low. Check your blood sugar the morning of your surgery when you wake up and every 2 hours until you get to the Short Stay unit. If your blood sugar is less than 70 mg/dL, you will need to treat for low blood sugar: Do not take insulin. Treat a low blood sugar (less than 70 mg/dL) with  cup of clear juice (cranberry or apple), 4 glucose tablets, OR glucose gel. Recheck blood sugar in 15 minutes after treatment (to make sure it is greater than 70 mg/dL). If your blood sugar is not greater than 70 mg/dL on recheck, call [phone] for further instructions. Report your blood sugar to the short stay nurse when you get to Short Stay.  If you are admitted to the hospital after surgery: Your blood sugar will be checked by the staff and you will probably be given insulin after surgery (instead of  oral diabetes medicines) to make sure you have good blood sugar levels. The goal for blood sugar control after surgery is 80-180 mg/dL.   WHAT DO I DO ABOUT MY DIABETES MEDICATION?  Do not take oral diabetes medicines (pills) the morning of surgery.  Do not take [facility] 05/04/23.  DO NOT TAKE THE FOLLOWING 7 DAYS PRIOR TO SURGERY: Ozempic, Wegovy, Rybelsus (Semaglutide), Byetta (exenatide), Bydureon (exenatide ER), Victoza, Saxenda (liraglutide), or Trulicity (dulaglutide) Mounjaro (Tirzepatide) Adlyxin (Lixisenatide), Polyethylene Glycol Loxenatide.   Reviewed and Endorsed by [facility] Patient Education Committee, August 2015  Bring CPAP mask and tubing day of surgery.                              You may not have any metal on your body including jewelry, and body piercing             Do not wear lotions, powders, cologne, or deodorant              Men may shave face and neck.   Do not bring valuables to the hospital. [facility] IS NOT             RESPONSIBLE   FOR VALUABLES.   Contacts, glasses, dentures or bridgework may not be worn into surgery.   Bring small overnight bag day of surgery.   DO NOT BRING YOUR HOME MEDICATIONS TO THE HOSPITAL. PHARMACY WILL DISPENSE MEDICATIONS LISTED ON YOUR MEDICATION LIST TO YOU DURING YOUR ADMISSION IN THE HOSPITAL!   Special Instructions: Bring a copy of your healthcare power of attorney and living will documents the day of surgery if you haven't scanned them before.              Please read over the following fact sheets you were given: IF YOU HAVE QUESTIONS  ABOUT YOUR PRE-OP INSTRUCTIONS PLEASE CALL [name]    If you received a COVID test during your pre-op visit  it is requested that you wear a mask when out in public, stay away from anyone that may not be feeling well and notify your surgeon if you develop symptoms. If you test positive for Covid or have been in contact with anyone that has tested positive in the last  10 days please notify you surgeon.    [facility] - Preparing for Surgery Before surgery, you can play an important role.  Because skin is not sterile, your skin needs to be as free of germs as possible.  You can reduce the number of germs on your skin by washing with CHG (chlorahexidine gluconate) soap before surgery.  CHG is an antiseptic cleaner which kills germs and bonds with the skin to continue killing germs even after washing. Please DO NOT use if you have an allergy to CHG or antibacterial soaps.  If your skin becomes reddened/irritated stop using the CHG and inform your nurse when you arrive at Short Stay. Do not shave (including legs and underarms) for at least 48 hours prior to the first CHG shower.  You may shave your face/neck.  Please follow these instructions carefully:  1.  Shower with CHG Soap the night before surgery and the  morning of surgery.  2.  If you choose to wash your hair, wash your hair first as usual with your normal  shampoo.  3.  After you shampoo, rinse your hair and body thoroughly to remove the shampoo.                             4.  Use CHG as you would any other liquid soap.  You can apply chg directly to the skin and wash.  Gently with a scrungie or clean washcloth.  5.  Apply the CHG Soap to your body ONLY FROM THE NECK DOWN.   Do   not use on face/ open                           Wound or open sores. Avoid contact with eyes, ears mouth and   genitals (private parts).                       Wash face,  Genitals (private parts) with your normal soap.             6.  Wash thoroughly, paying special attention to the area where your    surgery  will be performed.  7.  Thoroughly rinse your body with warm water from the neck down.  8.  DO NOT shower/wash with your normal soap after using and rinsing off the CHG Soap.                9.  Pat yourself dry with a clean towel.            10.  Wear clean pajamas.            11.  Place clean sheets on your bed the night  of your first shower and do not  sleep with pets. Day of Surgery : Do not apply any lotions/deodorants the morning of surgery.  Please wear clean clothes to the hospital/surgery center.  FAILURE TO FOLLOW THESE INSTRUCTIONS MAY RESULT IN THE CANCELLATION OF  YOUR SURGERY  PATIENT SIGNATURE_________________________________  NURSE SIGNATURE__________________________________  ________________________________________________________________________ WHAT IS A BLOOD TRANSFUSION? Blood Transfusion Information  A transfusion is the replacement of blood or some of its parts. Blood is made up of multiple cells which provide different functions. Red blood cells carry oxygen and are used for blood loss replacement. White blood cells fight against infection. Platelets control bleeding. Plasma helps clot blood. Other blood products are available for specialized needs, such as hemophilia or other clotting disorders. BEFORE THE TRANSFUSION  Who gives blood for transfusions?  Healthy volunteers who are fully evaluated to make sure their blood is safe. This is blood bank blood. Transfusion therapy is the safest it has ever been in the practice of medicine. Before blood is taken from a donor, a complete history is taken to make sure that person has no history of diseases nor engages in risky social behavior (examples are intravenous drug use or sexual activity with multiple partners). The donor's travel history is screened to minimize risk of transmitting infections, such as malaria. The donated blood is tested for signs of infectious diseases, such as HIV and hepatitis. The blood is then tested to be sure it is compatible with you in order to minimize the chance of a transfusion reaction. If you or a relative donates blood, this is often done in anticipation of surgery and is not appropriate for emergency situations. It takes many days to process the donated blood. RISKS AND COMPLICATIONS Although transfusion  therapy is very safe and saves many lives, the main dangers of transfusion include:  Getting an infectious disease. Developing a transfusion reaction. This is an allergic reaction to something in the blood you were given. Every precaution is taken to prevent this. The decision to have a blood transfusion has been considered carefully by your caregiver before blood is given. Blood is not given unless the benefits outweigh the risks. AFTER THE TRANSFUSION Right after receiving a blood transfusion, you will usually feel much better and more energetic. This is especially true if your red blood cells have gotten low (anemic). The transfusion raises the level of the red blood cells which carry oxygen, and this usually causes an energy increase. The nurse administering the transfusion will monitor you carefully for complications. HOME CARE INSTRUCTIONS  No special instructions are needed after a transfusion. You may find your energy is better. Speak with your caregiver about any limitations on activity for underlying diseases you may have. SEEK MEDICAL CARE IF:  Your condition is not improving after your transfusion. You develop redness or irritation at the intravenous (IV) site. SEEK IMMEDIATE MEDICAL CARE IF:  Any of the following symptoms occur over the next 12 hours: Shaking chills. You have a temperature by mouth above 102 F (38.9 C), not controlled by medicine. Chest, back, or muscle pain. People around you feel you are not acting correctly or are confused. Shortness of breath or difficulty breathing. Dizziness and fainting. You get a rash or develop hives. You have a decrease in urine output. Your urine turns a dark color or changes to pink, red, or brown. Any of the following symptoms occur over the next 10 days: You have a temperature by mouth above 102 F (38.9 C), not controlled by medicine. Shortness of breath. Weakness after normal activity. The white part of the eye turns yellow  (jaundice). You have a decrease in the amount of urine or are urinating less often. Your urine turns a dark color or changes to pink, red, or brown.  Document Released: 10/04/2000 Document Revised: 12/30/2011 Document Reviewed: 05/23/2008 [facility] Patient Information 2014 [place], [place].  _______________________________________________________________________

## 2023-04-27 DIAGNOSIS — G4733 Obstructive sleep apnea (adult) (pediatric): Secondary | ICD-10-CM | POA: Diagnosis not present

## 2023-04-28 DIAGNOSIS — M6281 Muscle weakness (generalized): Secondary | ICD-10-CM | POA: Diagnosis not present

## 2023-04-28 DIAGNOSIS — N393 Stress incontinence (female) (male): Secondary | ICD-10-CM | POA: Diagnosis not present

## 2023-04-28 DIAGNOSIS — M62838 Other muscle spasm: Secondary | ICD-10-CM | POA: Diagnosis not present

## 2023-04-29 ENCOUNTER — Other Ambulatory Visit: Payer: Self-pay

## 2023-04-29 ENCOUNTER — Encounter (HOSPITAL_COMMUNITY): Payer: Self-pay

## 2023-04-29 ENCOUNTER — Encounter (HOSPITAL_COMMUNITY)
Admission: RE | Admit: 2023-04-29 | Discharge: 2023-04-29 | Disposition: A | Discharge status: Home / Self Care | Payer: BC Managed Care – PPO | Source: Ambulatory Visit | Attending: Urology | Admitting: Urology

## 2023-04-29 VITALS — BP 125/80 | HR 91 | Temp 98.4°F | Resp 16 | Ht 73.0 in | Wt 271.0 lb

## 2023-04-29 DIAGNOSIS — E119 Type 2 diabetes mellitus without complications: Secondary | ICD-10-CM | POA: Insufficient documentation

## 2023-04-29 DIAGNOSIS — Z01818 Encounter for other preprocedural examination: Secondary | ICD-10-CM | POA: Insufficient documentation

## 2023-04-29 DIAGNOSIS — C61 Malignant neoplasm of prostate: Secondary | ICD-10-CM | POA: Diagnosis not present

## 2023-04-29 HISTORY — DX: Headache, unspecified: R51.9

## 2023-04-29 HISTORY — DX: Inflammatory liver disease, unspecified: K75.9

## 2023-04-29 HISTORY — DX: Unspecified osteoarthritis, unspecified site: M19.90

## 2023-04-29 LAB — BASIC METABOLIC PANEL
Anion gap: 7 (ref 5–15)
BUN: 14 mg/dL (ref 6–20)
CO2: 25 mmol/L (ref 22–32)
Calcium: 9.1 mg/dL (ref 8.9–10.3)
Chloride: 105 mmol/L (ref 98–111)
Creatinine, Ser: 0.88 mg/dL (ref 0.61–1.24)
GFR, Estimated: >60 mL/min (ref >60)
Glucose, Bld: 129 mg/dL — ABNORMAL HIGH (ref 70–99)
Potassium: 4.3 mmol/L (ref 3.5–5.1)
Sodium: 137 mmol/L (ref 135–145)

## 2023-04-29 LAB — CBC
HCT: 43 % (ref 39.0–52.0)
Hemoglobin: 13.7 g/dL (ref 13.0–17.0)
MCH: 27.7 pg (ref 26.0–34.0)
MCHC: 31.9 g/dL (ref 30.0–36.0)
MCV: 87 fL (ref 80.0–100.0)
Platelets: 334 10*3/uL (ref 150–400)
RBC: 4.94 MIL/uL (ref 4.22–5.81)
RDW: 12.6 % (ref 11.5–15.5)
WBC: 5.5 10*3/uL (ref 4.0–10.5)
nRBC: 0 % (ref 0.0–0.2)

## 2023-04-29 LAB — GLUCOSE, CAPILLARY: Glucose-Capillary: 129 mg/dL — ABNORMAL HIGH (ref 70–99)

## 2023-04-30 LAB — HEMOGLOBIN A1C
Hgb A1c MFr Bld: 7.1 % — ABNORMAL HIGH (ref 4.8–5.6)
Mean Plasma Glucose: 157 mg/dL

## 2023-05-05 DIAGNOSIS — M25571 Pain in right ankle and joints of right foot: Secondary | ICD-10-CM | POA: Diagnosis not present

## 2023-05-05 DIAGNOSIS — M25572 Pain in left ankle and joints of left foot: Secondary | ICD-10-CM | POA: Diagnosis not present

## 2023-05-05 DIAGNOSIS — G4733 Obstructive sleep apnea (adult) (pediatric): Secondary | ICD-10-CM | POA: Diagnosis not present

## 2023-05-05 DIAGNOSIS — M21372 Foot drop, left foot: Secondary | ICD-10-CM | POA: Diagnosis not present

## 2023-05-05 DIAGNOSIS — E669 Obesity, unspecified: Secondary | ICD-10-CM | POA: Diagnosis not present

## 2023-05-05 DIAGNOSIS — E1165 Type 2 diabetes mellitus with hyperglycemia: Secondary | ICD-10-CM | POA: Diagnosis not present

## 2023-05-06 NOTE — H&P (Signed)
Office Visit Report     04/29/2023   --------------------------------------------------------------------------------   Eric Galvan  MRN: [id number]  DOB: 11/01/1965, 57 year old Male  SSN: [id number]   PRIMARY CARE:  Jackie Plum, MD  PRIMARY CARE FAX:  [phone]  REFERRING:  Azucena Kuba  PROVIDER:  Berniece Salines, M.D.  TREATING:  [place], [place]  LOCATION:  [facility] [phone]     --------------------------------------------------------------------------------   CC/HPI: Pt presents today for pre-operative history and physical exam in anticipation of robotic assisted lap radical prostatectomy with bilateral pelvic lymph node dissection by Dr. Laverle Patter on 05/08/23. He is doing well and is without complaint.   Neck CT confirms no enlarged or abnormal density cervical lymph nodes and right parotid space lipoma.    Pt denies F/C, HA, CP, SOB, N/V, diarrhea/constipation, back pain, flank pain, hematuria, and dysuria.     HX:   CC: Prostate Cancer   Physician requesting consult: Dr. Cordella Register  PCP: Dr. Amada Kingfisher Bonsu   Mr. Eric Galvan is a 57 year old gentleman who works for the [employer] and was found to have an elevated PSA of 6.48. This prompted a TRUS biopsy of the prostate on 03/03/2023 that demonstrated Gleason 4+5 = 9 adenocarcinoma the prostate with 4 out of 12 cores positive for malignancy.   Family history: He has a brother who was diagnosed at age 28 with Gleason 8 prostate cancer and is status post a robotic prostatectomy at [facility]. He has a son who plays professional football with [employer] as a cornerback.   Imaging studies: PSMA PET scan (04/07/23) -uptake within the left side of the prostate. No metastatic disease. There was incidental uptake in the left side of the neck possibly representing uptake within the ganglia versus an incidental mass.   PMH: He has a history of diabetes, hyperlipidemia,  gastroesophageal reflux disease, and sleep apnea.  PSH: No abdominal surgeries.   TNM stage:cT1c N0 M0  PSA: 6.48  Gleason score: 4+5 = 9 (GG5)  Biopsy (03/03/2023): 4/12 cores positive  Left: Left lateral apex (10%, 4+5 = 9), left lateral mid (40%, 4+5 = 9), left base (20%, 4+4 = 8)  Right: Right lateral base (5%, 3+3 = 6)  Prostate volume: 40.3 cc    Nomogram  OC disease: 52%  EPE: 46%  SVI: 8%  LNI: 11%  PFS (5 year, 10 year): 53%, 37%   Urinary function: IPSS is 2.  Erectile function: SHIM score is 2.     ALLERGIES: Penicillins - Hives     Notes: Crab throat closes up/swelling    MEDICATIONS: Crestor  Triple Mix 0.5-1.37mL intracoporal inj, daily PRN  Triple Mix 1 ml Intracavernosal Daily PGE 80, PAP 30, PHE 1  Janumet 50-1000 MG Oral Tablet Oral  Mounjaro  Sildenafil Citrate 100 mg tablet 1 tablet PO Daily PRN     GU PSH: Prostate Needle Biopsy - 03/03/2023       PSH Notes: Upper Gastrointestinal Endoscopy (Therapeutic)   NON-GU PSH: Surgical Pathology, Gross And Microscopic Examination For Prostate Needle - 03/03/2023     GU PMH: Stress Incontinence - 04/28/2023 Prostate Cancer - 04/15/2023, - 04/08/2023, - 03/10/2023 Elevated PSA - 03/03/2023, - 01/09/2023, - 10/08/2022 ED due to arterial insufficiency (Stable) - 10/08/2022, - 2021, Erectile dysfunction due to arterial insufficiency, - 2017 BPH w/LUTS - 2021 Encounter for Prostate Cancer screening - 2021, Prostate cancer screening, - 2014 Nocturia - 2021 Primary hypogonadism,  Hypogonadism, testicular - June 02, 2015      PMH Notes: DM II last Ha1c 6   Diabetic neuropathy with drop foot on left    1) Prostate cancer: He is s/p a UNS RAL radical prostatectomy and BPLND on 05/08/23.   Diagnosis:  Pretreatment PSA: 6.48  Pretreatment SHIM score: 2   NON-GU PMH: Muscle weakness (generalized) - 04/28/2023, - 04/15/2023 Other muscle spasm - 04/28/2023 Personal history of other diseases of the digestive system, History of  esophageal reflux - 01-Jun-2016 Encounter for general adult medical examination without abnormal findings, Encounter for preventive health examination - 06/01/2013 Glycosuria, Glycosuria - 2013-06-01 Personal history of other diseases of the nervous system and sense organs, History of sleep apnea - 2014 Personal history of other specified conditions, History of heartburn - 06/01/2013    FAMILY HISTORY: Death - Mother, Father Diabetes - Runs In Family Family Health Status Number - Mother   SOCIAL HISTORY: Marital Status: Married Preferred Language: English; Ethnicity: Not Hispanic Or Latino; Race: Black or African American Current Smoking Status: Patient does not smoke anymore.   Tobacco Use Assessment Completed: Used Tobacco in last 30 days? Does not use smokeless tobacco. Social Drinker.  Does not use drugs. Drinks 1 caffeinated drink per day. Has not had a blood transfusion. 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     Notes: ETOH 4-5 liquor drinks per week    REVIEW OF SYSTEMS:    GU Review Male:   Patient denies frequent urination, hard to postpone urination, burning/ pain with urination, get up at night to urinate, leakage of urine, stream starts and stops, trouble starting your stream, have to strain to urinate , erection problems, and penile pain.  Gastrointestinal (Upper):   Patient denies nausea, vomiting, and indigestion/ heartburn.  Gastrointestinal (Lower):   Patient denies diarrhea and constipation.  Constitutional:   Patient denies fever, night sweats, weight loss, and fatigue.  Skin:   Patient denies skin rash/ lesion and itching.  Eyes:   Patient denies blurred vision and double vision.  Ears/ Nose/ Throat:   Patient denies sore throat and sinus problems.  Hematologic/Lymphatic:   Patient denies swollen glands and easy bruising.  Cardiovascular:   Patient denies leg swelling and chest pains.  Respiratory:   Patient denies cough and shortness of breath.  Endocrine:   Patient denies  excessive thirst.  Musculoskeletal:   Patient denies back pain and joint pain.  Neurological:   Patient denies headaches and dizziness.  Psychologic:   Patient denies depression and anxiety.   VITAL SIGNS:      04/29/2023 02:09 PM  BP 137/78 mmHg  Pulse 94 /min  Temperature 96.6 F / 35.8 C   MULTI-SYSTEM PHYSICAL EXAMINATION:    Constitutional: Well-nourished. No physical deformities. Normally developed. Good grooming.  Neck: Neck symmetrical, not swollen. Normal tracheal position.  Respiratory: Normal breath sounds. No labored breathing, no use of accessory muscles.   Cardiovascular: Regular rate and rhythm. No murmur, no gallop.   Lymphatic: No enlargement of neck, axillae, groin.  Skin: No paleness, no jaundice, no cyanosis. No lesion, no ulcer, no rash.  Neurologic / Psychiatric: Oriented to time, oriented to place, oriented to person. No depression, no anxiety, no agitation.  Gastrointestinal: No mass, no tenderness, no rigidity, obese abdomen.   Eyes: Normal conjunctivae. Normal eyelids.  Ears, Nose, Mouth, and Throat: Left ear no scars, no lesions, no masses. Right ear no scars, no lesions, no masses. Nose no scars, no lesions, no masses. Normal hearing. Normal  lips.  Musculoskeletal: Normal gait and station of head and neck.     Complexity of Data:  Records Review:   Previous Patient Records  Urine Test Review:   Urinalysis   PROCEDURES:          Urinalysis - 81003 Dipstick Dipstick Cont'd  Color: Yellow Bilirubin: Neg  Appearance: Clear Ketones: Neg  Specific Gravity: 1.025 Blood: Neg  pH: 6.0 Protein: Neg  Glucose: Neg Urobilinogen: 0.2    Nitrites: Neg    Leukocyte Esterase: Neg    Notes:      ASSESSMENT:      ICD-10 Details  1 GU:   Prostate Cancer - C61    PLAN:           Schedule Return Visit/Planned Activity: Keep Scheduled Appointment - Schedule Surgery          Document Letter(s):  Created for Patient: Clinical Summary         Notes:    There are no changes in the patients history or physical exam since last evaluation by Dr. Laverle Patter. Pt is scheduled to undergo RALP with BPLND on 05/08/23.   Results of UA did not cross over but it was negative.   All pt's questions were answered to the best of my ability.          Next Appointment:      Next Appointment: 05/08/2023 07:15 AM    Appointment Type: Surgery     Location: [facility] [phone]    Provider: Heloise Purpura, M.D.    Reason for Visit: WL/OBS RA LAP RAD PROSTATECTOMY LEV 2 AND BPLND WITH AMANDA0-$500 due 07/12      * Signed by Ulyses Amor, PA on 05/01/23 at 2:50 PM (EDT)*

## 2023-05-07 NOTE — Anesthesia Preprocedure Evaluation (Addendum)
Anesthesia Evaluation  Patient identified by MRN, date of birth, ID band Patient awake    Reviewed: Allergy & Precautions, H&P , NPO status , Patient's Chart, lab work & pertinent test results  Airway Mallampati: II  TM Distance: >3 FB Neck ROM: Full    Dental no notable dental hx. (+) Teeth Intact, Dental Advisory Given   Pulmonary neg pulmonary ROS, sleep apnea and Continuous Positive Airway Pressure Ventilation , former smoker   Pulmonary exam normal breath sounds clear to auscultation       Cardiovascular negative cardio ROS Normal cardiovascular exam Rhythm:Regular Rate:Normal     Neuro/Psych  Headaches Neck pain  negative neurological ROS  negative psych ROS   GI/Hepatic negative GI ROS, Neg liver ROS,GERD  Medicated,,  Endo/Other  negative endocrine ROSdiabetes, Type 2, Oral Hypoglycemic Agents  Obesity   Renal/GU negative Renal ROS  negative genitourinary   Musculoskeletal negative musculoskeletal ROS (+)    Abdominal   Peds negative pediatric ROS (+)  Hematology negative hematology ROS (+)   Anesthesia Other Findings Day of surgery medications reviewed with the patient.  Reproductive/Obstetrics negative OB ROS                             Anesthesia Physical Anesthesia Plan  ASA: 3  Anesthesia Plan: General   Post-op Pain Management: Minimal or no pain anticipated, Dilaudid IV and Tylenol PO (pre-op)*   Induction: Intravenous and Cricoid pressure planned  PONV Risk Score and Plan: 2 and Dexamethasone, Ondansetron and Treatment may vary due to age or medical condition  Airway Management Planned: Oral ETT  Additional Equipment: None  Intra-op Plan:   Post-operative Plan: Extubation in OR  Informed Consent: I have reviewed the patients History and Physical, chart, labs and discussed the procedure including the risks, benefits and alternatives for the proposed  anesthesia with the patient or authorized representative who has indicated his/her understanding and acceptance.     Dental advisory given  Plan Discussed with: CRNA and Anesthesiologist  Anesthesia Plan Comments: (2 large bore IV's)        Anesthesia Quick Evaluation

## 2023-05-08 ENCOUNTER — Encounter (HOSPITAL_COMMUNITY)
Admission: RE | Disposition: A | Discharge status: Home / Self Care | Payer: Self-pay | Source: Ambulatory Visit | Attending: Urology

## 2023-05-08 ENCOUNTER — Ambulatory Visit (HOSPITAL_COMMUNITY): Payer: BC Managed Care – PPO | Admitting: Anesthesiology

## 2023-05-08 ENCOUNTER — Other Ambulatory Visit: Payer: Self-pay

## 2023-05-08 ENCOUNTER — Encounter (HOSPITAL_COMMUNITY): Payer: Self-pay | Admitting: Urology

## 2023-05-08 ENCOUNTER — Observation Stay (HOSPITAL_COMMUNITY)
Admission: RE | Admit: 2023-05-08 | Discharge: 2023-05-09 | Disposition: A | Discharge status: Home / Self Care | Payer: BC Managed Care – PPO | Source: Ambulatory Visit | Attending: Urology | Admitting: Urology

## 2023-05-08 DIAGNOSIS — C61 Malignant neoplasm of prostate: Secondary | ICD-10-CM | POA: Diagnosis not present

## 2023-05-08 DIAGNOSIS — E119 Type 2 diabetes mellitus without complications: Secondary | ICD-10-CM | POA: Insufficient documentation

## 2023-05-08 DIAGNOSIS — Z79899 Other long term (current) drug therapy: Secondary | ICD-10-CM | POA: Diagnosis not present

## 2023-05-08 DIAGNOSIS — G4733 Obstructive sleep apnea (adult) (pediatric): Secondary | ICD-10-CM | POA: Diagnosis not present

## 2023-05-08 DIAGNOSIS — E785 Hyperlipidemia, unspecified: Secondary | ICD-10-CM | POA: Diagnosis not present

## 2023-05-08 HISTORY — PX: ROBOT ASSISTED LAPAROSCOPIC RADICAL PROSTATECTOMY: SHX5141

## 2023-05-08 HISTORY — PX: LYMPHADENECTOMY: SHX5960

## 2023-05-08 LAB — TYPE AND SCREEN
ABO/RH(D): A POS
Antibody Screen: NEGATIVE

## 2023-05-08 LAB — HEMOGLOBIN AND HEMATOCRIT, BLOOD
HCT: 40.2 % (ref 39.0–52.0)
Hemoglobin: 12.8 g/dL — ABNORMAL LOW (ref 13.0–17.0)

## 2023-05-08 LAB — GLUCOSE, CAPILLARY
Glucose-Capillary: 119 mg/dL — ABNORMAL HIGH (ref 70–99)
Glucose-Capillary: 138 mg/dL — ABNORMAL HIGH (ref 70–99)
Glucose-Capillary: 170 mg/dL — ABNORMAL HIGH (ref 70–99)
Glucose-Capillary: 167 mg/dL — ABNORMAL HIGH (ref 70–99)
Glucose-Capillary: 161 mg/dL — ABNORMAL HIGH (ref 70–99)
Glucose-Capillary: 138 mg/dL — ABNORMAL HIGH (ref 70–99)

## 2023-05-08 LAB — ABO/RH: ABO/RH(D): A POS

## 2023-05-08 SURGERY — XI ROBOTIC ASSISTED LAPAROSCOPIC RADICAL PROSTATECTOMY LEVEL 2
Anesthesia: General

## 2023-05-08 MED ORDER — ONDANSETRON HCL 4 MG/2ML IJ SOLN: 4.0000 mg | Freq: Once | INTRAMUSCULAR | Status: DC | PRN

## 2023-05-08 MED ORDER — BUPIVACAINE-EPINEPHRINE 0.25% -1:200000 IJ SOLN
INTRAMUSCULAR | Status: DC | PRN
  Administered 2023-05-08: 50 mL

## 2023-05-08 MED ORDER — FENTANYL CITRATE (PF) 250 MCG/5ML IJ SOLN
INTRAMUSCULAR | Status: DC | PRN
  Administered 2023-05-08: 100 ug via INTRAVENOUS
  Administered 2023-05-08: 50 ug via INTRAVENOUS
  Administered 2023-05-08: 100 ug via INTRAVENOUS

## 2023-05-08 MED ORDER — DEXAMETHASONE SODIUM PHOSPHATE 10 MG/ML IJ SOLN
INTRAMUSCULAR | Status: AC
  Filled 2023-05-08: qty 1

## 2023-05-08 MED ORDER — LIDOCAINE HCL (PF) 2 % IJ SOLN
INTRAMUSCULAR | Status: AC
  Filled 2023-05-08: qty 5

## 2023-05-08 MED ORDER — OXYCODONE HCL 5 MG/5ML PO SOLN: 5.0000 mg | Freq: Once | ORAL | Status: DC | PRN

## 2023-05-08 MED ORDER — DOCUSATE SODIUM 100 MG PO CAPS
100.0000 mg | ORAL_CAPSULE | Freq: Two times a day (BID) | ORAL | Status: DC
  Administered 2023-05-08 – 2023-05-09 (×2): 100 mg via ORAL
  Filled 2023-05-08 (×2): qty 1

## 2023-05-08 MED ORDER — LACTATED RINGERS IV SOLN: INTRAVENOUS | Status: DC

## 2023-05-08 MED ORDER — MIDAZOLAM HCL 2 MG/2ML IJ SOLN
INTRAMUSCULAR | Status: DC | PRN
  Administered 2023-05-08: 2 mg via INTRAVENOUS

## 2023-05-08 MED ORDER — MEPERIDINE HCL 50 MG/ML IJ SOLN: 6.2500 mg | INTRAMUSCULAR | Status: DC | PRN

## 2023-05-08 MED ORDER — LIDOCAINE HCL (PF) 2 % IJ SOLN
INTRAMUSCULAR | Status: AC
  Filled 2023-05-08: qty 10

## 2023-05-08 MED ORDER — SULFAMETHOXAZOLE-TRIMETHOPRIM 800-160 MG PO TABS: 1.0000 | ORAL_TABLET | Freq: Two times a day (BID) | ORAL | 0 refills | Status: AC

## 2023-05-08 MED ORDER — FENTANYL CITRATE PF 50 MCG/ML IJ SOSY
PREFILLED_SYRINGE | INTRAMUSCULAR | Status: AC
  Filled 2023-05-08: qty 1

## 2023-05-08 MED ORDER — ORAL CARE MOUTH RINSE: 15.0000 mL | OROMUCOSAL | Status: DC | PRN

## 2023-05-08 MED ORDER — ORAL CARE MOUTH RINSE: 15.0000 mL | Freq: Once | OROMUCOSAL | Status: AC

## 2023-05-08 MED ORDER — ROSUVASTATIN CALCIUM 20 MG PO TABS: 40.0000 mg | ORAL_TABLET | Freq: Every day | ORAL | Status: DC

## 2023-05-08 MED ORDER — HEPARIN SODIUM (PORCINE) 1000 UNIT/ML IJ SOLN
INTRAMUSCULAR | Status: AC
  Filled 2023-05-08: qty 1

## 2023-05-08 MED ORDER — OXYCODONE HCL 5 MG PO TABS: 5.0000 mg | ORAL_TABLET | Freq: Once | ORAL | Status: DC | PRN

## 2023-05-08 MED ORDER — MORPHINE SULFATE (PF) 2 MG/ML IV SOLN
2.0000 mg | INTRAVENOUS | Status: DC | PRN
  Administered 2023-05-08 – 2023-05-09 (×4): 2 mg via INTRAVENOUS
  Filled 2023-05-08 (×4): qty 1

## 2023-05-08 MED ORDER — SODIUM CHLORIDE 0.9 % IR SOLN
Status: DC | PRN
  Administered 2023-05-08: 1000 mL

## 2023-05-08 MED ORDER — MAGNESIUM CITRATE PO SOLN: 1.0000 | Freq: Once | ORAL | Status: DC

## 2023-05-08 MED ORDER — ACETAMINOPHEN 325 MG PO TABS: 325.0000 mg | ORAL_TABLET | ORAL | Status: DC | PRN

## 2023-05-08 MED ORDER — ZOLPIDEM TARTRATE 5 MG PO TABS: 5.0000 mg | ORAL_TABLET | Freq: Every evening | ORAL | Status: DC | PRN

## 2023-05-08 MED ORDER — LIDOCAINE 20MG/ML (2%) 15 ML SYRINGE OPTIME
INTRAMUSCULAR | Status: DC | PRN
  Administered 2023-05-08: 1.5 mg/kg/h via INTRAVENOUS

## 2023-05-08 MED ORDER — ROCURONIUM BROMIDE 10 MG/ML (PF) SYRINGE
PREFILLED_SYRINGE | INTRAVENOUS | Status: DC | PRN
  Administered 2023-05-08: 20 mg via INTRAVENOUS
  Administered 2023-05-08: 100 mg via INTRAVENOUS
  Administered 2023-05-08 (×2): 20 mg via INTRAVENOUS

## 2023-05-08 MED ORDER — ONDANSETRON HCL 4 MG/2ML IJ SOLN
INTRAMUSCULAR | Status: AC
  Filled 2023-05-08: qty 2

## 2023-05-08 MED ORDER — FENTANYL CITRATE PF 50 MCG/ML IJ SOSY
PREFILLED_SYRINGE | INTRAMUSCULAR | Status: AC
  Filled 2023-05-08: qty 2

## 2023-05-08 MED ORDER — DEXAMETHASONE SODIUM PHOSPHATE 10 MG/ML IJ SOLN
INTRAMUSCULAR | Status: DC | PRN
  Administered 2023-05-08: 4 mg via INTRAVENOUS

## 2023-05-08 MED ORDER — ONDANSETRON HCL 4 MG/2ML IJ SOLN
INTRAMUSCULAR | Status: DC | PRN
  Administered 2023-05-08: 4 mg via INTRAVENOUS

## 2023-05-08 MED ORDER — FENTANYL CITRATE (PF) 250 MCG/5ML IJ SOLN
INTRAMUSCULAR | Status: AC
  Filled 2023-05-08: qty 5

## 2023-05-08 MED ORDER — HYOSCYAMINE SULFATE 0.125 MG SL SUBL
0.1250 mg | SUBLINGUAL_TABLET | Freq: Four times a day (QID) | SUBLINGUAL | Status: DC | PRN
  Administered 2023-05-08: 0.125 mg via SUBLINGUAL
  Filled 2023-05-08: qty 1

## 2023-05-08 MED ORDER — VANCOMYCIN HCL 1500 MG/300ML IV SOLN
1500.0000 mg | Freq: Once | INTRAVENOUS | Status: AC
  Administered 2023-05-08: 1500 mg via INTRAVENOUS
  Filled 2023-05-08: qty 300

## 2023-05-08 MED ORDER — VANCOMYCIN HCL IN DEXTROSE 1-5 GM/200ML-% IV SOLN
1000.0000 mg | Freq: Two times a day (BID) | INTRAVENOUS | Status: AC
  Administered 2023-05-08: 1000 mg via INTRAVENOUS
  Filled 2023-05-08: qty 200

## 2023-05-08 MED ORDER — DOCUSATE SODIUM 100 MG PO CAPS: 100.0000 mg | ORAL_CAPSULE | Freq: Two times a day (BID) | ORAL | Status: AC

## 2023-05-08 MED ORDER — KETOROLAC TROMETHAMINE 15 MG/ML IJ SOLN
15.0000 mg | Freq: Four times a day (QID) | INTRAMUSCULAR | Status: DC
  Administered 2023-05-08 – 2023-05-09 (×5): 15 mg via INTRAVENOUS
  Filled 2023-05-08 (×5): qty 1

## 2023-05-08 MED ORDER — TRIPLE ANTIBIOTIC 3.5-400-5000 EX OINT: 1.0000 | TOPICAL_OINTMENT | Freq: Three times a day (TID) | CUTANEOUS | Status: DC | PRN

## 2023-05-08 MED ORDER — KETAMINE HCL 10 MG/ML IJ SOLN
INTRAMUSCULAR | Status: DC | PRN
  Administered 2023-05-08: 10 mg via INTRAVENOUS
  Administered 2023-05-08: 40 mg via INTRAVENOUS

## 2023-05-08 MED ORDER — ONDANSETRON HCL 4 MG/2ML IJ SOLN: 4.0000 mg | INTRAMUSCULAR | Status: DC | PRN

## 2023-05-08 MED ORDER — SODIUM CHLORIDE 0.9 % IV BOLUS
1000.0000 mL | Freq: Once | INTRAVENOUS | Status: AC
  Administered 2023-05-08: 1000 mL via INTRAVENOUS

## 2023-05-08 MED ORDER — PROPOFOL 10 MG/ML IV BOLUS
INTRAVENOUS | Status: AC
  Filled 2023-05-08: qty 20

## 2023-05-08 MED ORDER — INSULIN ASPART 100 UNIT/ML IJ SOLN
0.0000 [IU] | INTRAMUSCULAR | Status: DC | PRN
  Administered 2023-05-08: 2 [IU] via SUBCUTANEOUS

## 2023-05-08 MED ORDER — LACTATED RINGERS IR SOLN
Status: DC | PRN
  Administered 2023-05-08: 1000 mL

## 2023-05-08 MED ORDER — INSULIN ASPART 100 UNIT/ML IJ SOLN
0.0000 [IU] | INTRAMUSCULAR | Status: DC
  Administered 2023-05-08 (×2): 3 [IU] via SUBCUTANEOUS
  Administered 2023-05-08 – 2023-05-09 (×2): 2 [IU] via SUBCUTANEOUS
  Administered 2023-05-09: 1 [IU] via SUBCUTANEOUS

## 2023-05-08 MED ORDER — PHENYLEPHRINE 80 MCG/ML (10ML) SYRINGE FOR IV PUSH (FOR BLOOD PRESSURE SUPPORT)
PREFILLED_SYRINGE | INTRAVENOUS | Status: AC
  Filled 2023-05-08: qty 10

## 2023-05-08 MED ORDER — PROPOFOL 10 MG/ML IV BOLUS
INTRAVENOUS | Status: DC | PRN
Start: 2023-05-08 — End: 2023-05-08
  Administered 2023-05-08: 200 mg via INTRAVENOUS

## 2023-05-08 MED ORDER — MIDAZOLAM HCL 2 MG/2ML IJ SOLN
INTRAMUSCULAR | Status: AC
  Filled 2023-05-08: qty 2

## 2023-05-08 MED ORDER — LIDOCAINE 2% (20 MG/ML) 5 ML SYRINGE
INTRAMUSCULAR | Status: DC | PRN
  Administered 2023-05-08: 60 mg via INTRAVENOUS

## 2023-05-08 MED ORDER — DIPHENHYDRAMINE HCL 12.5 MG/5ML PO ELIX: 12.5000 mg | ORAL_SOLUTION | Freq: Four times a day (QID) | ORAL | Status: DC | PRN

## 2023-05-08 MED ORDER — ACETAMINOPHEN 325 MG PO TABS: 650.0000 mg | ORAL_TABLET | ORAL | Status: DC | PRN

## 2023-05-08 MED ORDER — ACETAMINOPHEN 500 MG PO TABS
1000.0000 mg | ORAL_TABLET | Freq: Once | ORAL | Status: AC
  Administered 2023-05-08: 1000 mg via ORAL
  Filled 2023-05-08: qty 2

## 2023-05-08 MED ORDER — LACTATED RINGERS IV SOLN
INTRAVENOUS | Status: DC | PRN
  Administered 2023-05-08: 1 mL

## 2023-05-08 MED ORDER — LACTATED RINGERS IV SOLN: INTRAVENOUS | Status: DC | PRN

## 2023-05-08 MED ORDER — CHLORHEXIDINE GLUCONATE 0.12 % MT SOLN
15.0000 mL | Freq: Once | OROMUCOSAL | Status: AC
  Administered 2023-05-08: 15 mL via OROMUCOSAL

## 2023-05-08 MED ORDER — ROCURONIUM BROMIDE 10 MG/ML (PF) SYRINGE
PREFILLED_SYRINGE | INTRAVENOUS | Status: AC
  Filled 2023-05-08: qty 10

## 2023-05-08 MED ORDER — SUGAMMADEX SODIUM 200 MG/2ML IV SOLN
INTRAVENOUS | Status: DC | PRN
  Administered 2023-05-08: 300 mg via INTRAVENOUS

## 2023-05-08 MED ORDER — STERILE WATER FOR IRRIGATION IR SOLN
Status: DC | PRN
  Administered 2023-05-08: 1000 mL

## 2023-05-08 MED ORDER — POTASSIUM CHLORIDE IN NACL 20-0.45 MEQ/L-% IV SOLN
INTRAVENOUS | Status: DC
  Filled 2023-05-08 (×4): qty 1000

## 2023-05-08 MED ORDER — ACETAMINOPHEN 160 MG/5ML PO SOLN: 325.0000 mg | ORAL | Status: DC | PRN

## 2023-05-08 MED ORDER — FLEET ENEMA 7-19 GM/118ML RE ENEM: 1.0000 | ENEMA | Freq: Once | RECTAL | Status: DC

## 2023-05-08 MED ORDER — PHENYLEPHRINE 80 MCG/ML (10ML) SYRINGE FOR IV PUSH (FOR BLOOD PRESSURE SUPPORT)
PREFILLED_SYRINGE | INTRAVENOUS | Status: DC | PRN
  Administered 2023-05-08: 160 ug via INTRAVENOUS

## 2023-05-08 MED ORDER — KETAMINE HCL 50 MG/5ML IJ SOSY
PREFILLED_SYRINGE | INTRAMUSCULAR | Status: AC
  Filled 2023-05-08: qty 5

## 2023-05-08 MED ORDER — DIPHENHYDRAMINE HCL 50 MG/ML IJ SOLN: 12.5000 mg | Freq: Four times a day (QID) | INTRAMUSCULAR | Status: DC | PRN

## 2023-05-08 MED ORDER — TRAMADOL HCL 50 MG PO TABS: 50.0000 mg | ORAL_TABLET | Freq: Four times a day (QID) | ORAL | 0 refills | Status: AC | PRN

## 2023-05-08 MED ORDER — FENTANYL CITRATE PF 50 MCG/ML IJ SOSY
25.0000 ug | PREFILLED_SYRINGE | INTRAMUSCULAR | Status: DC | PRN
  Administered 2023-05-08 (×3): 50 ug via INTRAVENOUS

## 2023-05-08 MED ORDER — BUPIVACAINE-EPINEPHRINE 0.25% -1:200000 IJ SOLN
INTRAMUSCULAR | Status: AC
  Filled 2023-05-08: qty 1

## 2023-05-08 SURGICAL SUPPLY — 68 items
ADH SKN CLS APL DERMABOND .7 (GAUZE/BANDAGES/DRESSINGS) ×2
APL PRP STRL LF DISP 70% ISPRP (MISCELLANEOUS) ×2
APL SWBSTK 6 STRL LF DISP (MISCELLANEOUS) ×2
APPLICATOR COTTON TIP 6 STRL (MISCELLANEOUS) ×2 IMPLANT
APPLICATOR COTTON TIP 6IN STRL (MISCELLANEOUS) ×2
BAG COUNTER SPONGE SURGICOUNT (BAG) IMPLANT
BAG SPNG CNTER NS LX DISP (BAG)
CATH FOLEY 2WAY SLVR 18FR 30CC (CATHETERS) ×2 IMPLANT
CATH ROBINSON RED A/P 16FR (CATHETERS) ×2 IMPLANT
CATH ROBINSON RED A/P 8FR (CATHETERS) ×2 IMPLANT
CATH TIEMANN FOLEY 18FR 5CC (CATHETERS) ×2 IMPLANT
CHLORAPREP W/TINT 26 (MISCELLANEOUS) ×2 IMPLANT
CLIP LIGATING HEM O LOK PURPLE (MISCELLANEOUS) ×2 IMPLANT
COVER SURGICAL LIGHT HANDLE (MISCELLANEOUS) ×2 IMPLANT
COVER TIP SHEARS 8 DVNC (MISCELLANEOUS) ×2 IMPLANT
CUTTER ECHEON FLEX ENDO 45 340 (ENDOMECHANICALS) ×2 IMPLANT
DERMABOND ADVANCED .7 DNX12 (GAUZE/BANDAGES/DRESSINGS) ×2 IMPLANT
DRAIN CHANNEL RND F F (WOUND CARE) IMPLANT
DRAPE ARM DVNC X/XI (DISPOSABLE) ×8 IMPLANT
DRAPE COLUMN DVNC XI (DISPOSABLE) ×2 IMPLANT
DRAPE SURG IRRIG POUCH 19X23 (DRAPES) ×2 IMPLANT
DRIVER NDL LRG 8 DVNC XI (INSTRUMENTS) ×4 IMPLANT
DRIVER NDLE LRG 8 DVNC XI (INSTRUMENTS) ×4
DRSG TEGADERM 4X4.75 (GAUZE/BANDAGES/DRESSINGS) ×2 IMPLANT
ELECT PENCIL ROCKER SW 15FT (MISCELLANEOUS) ×2 IMPLANT
ELECT REM PT RETURN 15FT ADLT (MISCELLANEOUS) ×2 IMPLANT
FORCEPS BPLR LNG DVNC XI (INSTRUMENTS) ×2 IMPLANT
FORCEPS PROGRASP DVNC XI (FORCEP) ×2 IMPLANT
GAUZE SPONGE 4X4 12PLY STRL (GAUZE/BANDAGES/DRESSINGS) ×2 IMPLANT
GLOVE BIO SURGEON STRL SZ 6.5 (GLOVE) ×2 IMPLANT
GLOVE SURG LX STRL 7.5 STRW (GLOVE) ×4 IMPLANT
GOWN STRL REUS W/ TWL XL LVL3 (GOWN DISPOSABLE) ×4 IMPLANT
GOWN STRL REUS W/TWL XL LVL3 (GOWN DISPOSABLE) ×4
GOWN STRL SURGICAL XL XLNG (GOWN DISPOSABLE) ×2 IMPLANT
HOLDER FOLEY CATH W/STRAP (MISCELLANEOUS) ×2 IMPLANT
IRRIG SUCT STRYKERFLOW 2 WTIP (MISCELLANEOUS) ×2
IRRIGATION SUCT STRKRFLW 2 WTP (MISCELLANEOUS) ×2 IMPLANT
IV LACTATED RINGERS 1000ML (IV SOLUTION) ×2 IMPLANT
IV NS 1000ML (IV SOLUTION) ×2
IV NS 1000ML BAXH (IV SOLUTION) IMPLANT
KIT TURNOVER KIT A (KITS) IMPLANT
NDL SAFETY ECLIP 18X1.5 (MISCELLANEOUS) ×2 IMPLANT
PACK ROBOT UROLOGY CUSTOM (CUSTOM PROCEDURE TRAY) ×2 IMPLANT
PLUG CATH AND CAP STRL 200 (CATHETERS) ×2 IMPLANT
RELOAD STAPLE 45 4.1 GRN THCK (STAPLE) ×2 IMPLANT
SCISSORS MNPLR CVD DVNC XI (INSTRUMENTS) ×2 IMPLANT
SEAL UNIV 5-12 XI (MISCELLANEOUS) ×8 IMPLANT
SET CYSTO W/LG BORE CLAMP LF (SET/KITS/TRAYS/PACK) IMPLANT
SET TUBE SMOKE EVAC HIGH FLOW (TUBING) ×2 IMPLANT
SOL ELECTROSURG ANTI STICK (MISCELLANEOUS) ×2
SOL PREP POV-IOD 4OZ 10% (MISCELLANEOUS) ×2 IMPLANT
SOLUTION ELECTROSURG ANTI STCK (MISCELLANEOUS) ×2 IMPLANT
SPIKE FLUID TRANSFER (MISCELLANEOUS) ×2 IMPLANT
STAPLE RELOAD 45 GRN (STAPLE) ×2
STAPLE RELOAD 45MM GREEN (STAPLE) ×2
SUT ETHILON 3 0 PS 1 (SUTURE) ×2 IMPLANT
SUT MNCRL 3 0 RB1 (SUTURE) ×2 IMPLANT
SUT MNCRL 3 0 VIOLET RB1 (SUTURE) ×2 IMPLANT
SUT MNCRL AB 4-0 PS2 18 (SUTURE) ×4 IMPLANT
SUT PDS PLUS AB 0 CT-2 (SUTURE) ×4 IMPLANT
SUT VIC AB 0 CT1 27 (SUTURE) ×4
SUT VIC AB 0 CT1 27XBRD ANTBC (SUTURE) ×4 IMPLANT
SUT VIC AB 2-0 SH 27 (SUTURE) ×2
SUT VIC AB 2-0 SH 27X BRD (SUTURE) ×2 IMPLANT
SYR 27GX1/2 1ML LL SAFETY (SYRINGE) ×2 IMPLANT
TOWEL OR NON WOVEN STRL DISP B (DISPOSABLE) ×2 IMPLANT
TROCAR Z THREAD OPTICAL 12X100 (TROCAR) IMPLANT
WATER STERILE IRR 1000ML POUR (IV SOLUTION) ×2 IMPLANT

## 2023-05-08 NOTE — Op Note (Signed)
Preoperative diagnosis: Clinically localized adenocarcinoma of the prostate (clinical stage T1c N0 M0)  Postoperative diagnosis: Clinically localized adenocarcinoma of the prostate (clinical stage T1c N0 M0)  Procedure:  Robotic assisted laparoscopic radical prostatectomy (right nerve sparing) Bilateral robotic assisted laparoscopic pelvic lymphadenectomy  Surgeon: [name]. M.D.  Assistant(s): Harrie Foreman, PA-C  An assistant was required for this surgical procedure.  The duties of the assistant included but were not limited to suctioning, passing suture, camera manipulation, retraction. This procedure would not be able to be performed without an Geophysicist/field seismologist.   Anesthesia: General  Complications: None  EBL: 350 mL  IVF:  2000 mL crystalloid  Specimens: Prostate and seminal vesicles Right pelvic lymph nodes Left pelvic lymph nodes  Disposition of specimens: Pathology  Drains: 20 Fr coude catheter # 19 Blake pelvic drain  Indication: Eric Galvan is a 57 y.o. patient with clinically localized prostate cancer.  After a thorough review of the management options for treatment of prostate cancer, he elected to proceed with surgical therapy and the above procedure(s).  We have discussed the potential benefits and risks of the procedure, side effects of the proposed treatment, the likelihood of the patient achieving the goals of the procedure, and any potential problems that might occur during the procedure or recuperation. Informed consent has been obtained.  Description of procedure:  The patient was taken to the operating room and a general anesthetic was administered. He was given preoperative antibiotics, placed in the dorsal lithotomy position, and prepped and draped in the usual sterile fashion. Next a preoperative timeout was performed. A urethral catheter was placed into the bladder and a site was selected near the umbilicus for placement of the camera port.  This was placed using a standard open Hassan technique which allowed entry into the peritoneal cavity under direct vision and without difficulty. An 8 mm port was placed and a pneumoperitoneum established. The camera was then used to inspect the abdomen and there was no evidence of any intra-abdominal injuries or other abnormalities. The remaining abdominal ports were then placed. 8 mm robotic ports were placed in the right lower quadrant, left lower quadrant, and far left lateral abdominal wall. A 5 mm port was placed in the right upper quadrant and a 12 mm port was placed in the right lateral abdominal wall for laparoscopic assistance. All ports were placed under direct vision without difficulty. The surgical cart was then docked.   Utilizing the cautery scissors, the bladder was reflected posteriorly allowing entry into the space of Retzius and identification of the endopelvic fascia and prostate. The periprostatic fat was then removed from the prostate allowing full exposure of the endopelvic fascia. The endopelvic fascia was then incised from the apex back to the base of the prostate bilaterally and the underlying levator muscle fibers were swept laterally off the prostate thereby isolating the dorsal venous complex. The dorsal vein was then stapled and divided with a 45 mm Flex Echelon stapler. Attention then turned to the bladder neck which was divided anteriorly thereby allowing entry into the bladder and exposure of the urethral catheter. The catheter balloon was deflated and the catheter was brought into the operative field and used to retract the prostate anteriorly. The posterior bladder neck was then examined and was divided allowing further dissection between the bladder and prostate posteriorly until the vasa deferentia and seminal vessels were identified. The vasa deferentia were isolated, divided, and lifted anteriorly. The seminal vesicles were dissected down to their tips  with care to control  the seminal vascular arterial blood supply. These structures were then lifted anteriorly and the space between Denonvillier's fascia and the anterior rectum was developed with a combination of sharp and blunt dissection. This isolated the vascular pedicles of the prostate.  The lateral prostatic fascia on the right side of the prostate was then sharply incised allowing release of the neurovascular bundle. The vascular pedicle of the prostate on the right side was then ligated with Weck clips between the prostate and neurovascular bundle and divided with sharp cold scissor dissection resulting in neurovascular bundle preservation. On the left side, a wide non nerve sparing dissection was performed with Weck clips used to ligate the vascular pedicle of the prostate. The neurovascular bundle on the right side was then separated off the apex of the prostate and urethra.  The urethra was then sharply transected allowing the prostate specimen to be disarticulated. The pelvis was copiously irrigated and hemostasis was ensured. There was no evidence for rectal injury.  Attention then turned to the right pelvic sidewall. The fibrofatty tissue between the external iliac vein, confluence of the iliac vessels, hypogastric artery, and Cooper's ligament was dissected free from the pelvic sidewall with care to preserve the obturator nerve. Weck clips were used for lymphostasis and hemostasis. An identical procedure was performed on the contralateral side and the lymphatic packets were removed for permanent pathologic analysis.  Attention then turned to the urethral anastomosis. A 2-0 Vicryl slip knot was placed between Denonvillier's fascia, the posterior bladder neck, and the posterior urethra to reapproximate these structures. A double-armed 3-0 Monocryl suture was then used to perform a 360 running tension-free anastomosis between the bladder neck and urethra. A new urethral catheter was then placed into the bladder  and irrigated. There were no blood clots within the bladder and the anastomosis appeared to be watertight. A #19 Blake drain was then brought through the left lateral 8 mm port site and positioned appropriately within the pelvis. It was secured to the skin with a nylon suture. The surgical cart was then undocked. The right lateral 12 mm port site was closed at the fascial level with a 0 Vicryl suture placed laparoscopically. All remaining ports were then removed under direct vision. The prostate specimen was removed intact within the Endopouch retrieval bag via the periumbilical camera port site. This fascial opening was closed with two running 0 Vicryl sutures. 0.25% Marcaine was then injected into all port sites and all incisions were reapproximated at the skin level with 4-0 Monocryl subcuticular sutures. Dermabond was applied. The patient appeared to tolerate the procedure well and without complications. The patient was able to be extubated and transferred to the recovery unit in satisfactory condition.   [name] MD

## 2023-05-08 NOTE — Progress Notes (Signed)
   05/08/23 2113  BiPAP/CPAP/SIPAP  BiPAP/CPAP/SIPAP Pt Type Adult (Patient has his home machine and equipment. Patient prefers self placement when ready.)  Mask Type Nasal mask  Respiratory Rate 19 breaths/min  Heater Temperature  (sterile water added to the humidifier chamber)  Patient Home Equipment Yes

## 2023-05-08 NOTE — Interval H&P Note (Signed)
History and Physical Interval Note:  05/08/2023 6:51 AM  Eric Galvan  has presented today for surgery, with the diagnosis of PROSTATE CANCER.  The various methods of treatment have been discussed with the patient and family. After consideration of risks, benefits and other options for treatment, the patient has consented to  Procedure(s) with comments: XI ROBOTIC ASSISTED LAPAROSCOPIC RADICAL PROSTATECTOMY LEVEL 2 (N/A) - 210 MINUTES NEEDED FOR CASE BILATERAL PELVIC LYMPHADENECTOMY (Bilateral) as a surgical intervention.  The patient's history has been reviewed, patient examined, no change in status, stable for surgery.  I have reviewed the patient's chart and labs.  Questions were answered to the patient's satisfaction.     [employer]

## 2023-05-08 NOTE — Plan of Care (Signed)
  Problem: Bowel/Gastric: Goal: Gastrointestinal status for postoperative course will improve Outcome: Progressing   Problem: Pain Management: Goal: General experience of comfort will improve Outcome: Progressing   Problem: Skin Integrity: Goal: Demonstration of wound healing without infection will improve Outcome: Progressing   Problem: Education: Goal: Knowledge of the procedure and recovery process will improve Outcome: Adequate for Discharge   Problem: Education: Goal: Knowledge of General Education information will improve Description: Including pain rating scale, medication(s)/side effects and non-pharmacologic comfort measures Outcome: Adequate for Discharge   Problem: Health Behavior/Discharge Planning: Goal: Ability to manage health-related needs will improve Outcome: Adequate for Discharge

## 2023-05-08 NOTE — Discharge Summary (Signed)
Date of admission: 05/08/2023  Date of discharge: 05/11/2023  Admission diagnosis: prostate cancer  Discharge diagnosis: same  Secondary diagnoses:  Patient Active Problem List   Diagnosis Date Noted   Prostate cancer (HCC) 05/08/2023   Malignant neoplasm of prostate (HCC) 04/21/2023   OSA (obstructive sleep apnea) 04/24/2022   Back pain 06/05/2018   Special screening for malignant neoplasms, colon 09/10/2013   Dysphagia, unspecified(787.20) 07/09/2013   Diabetes type 2, controlled (HCC) 02/17/2013   Hyperlipidemia 02/17/2013   Allergic rhinitis 02/17/2013    Procedures performed: Procedure(s): XI ROBOTIC ASSISTED LAPAROSCOPIC RADICAL PROSTATECTOMY LEVEL 2 BILATERAL PELVIC LYMPHADENECTOMY  History and Physical: For full details, please see admission history and physical. Briefly, Eric Galvan is a 57 y.o. year old patient with gleason 4+5 prostate cancer, high risk disease who underwent robotic radical prostatectomy  .   Hospital Course: Patient tolerated the procedure well.  He was then transferred to the floor after an uneventful PACU stay.  His hospital course was uncomplicated.  On POD#1 he had met discharge criteria: was tolerating a diet, was up and ambulating independently,  pain was well controlled, voiding via the catheter and was ready to for discharge.   Laboratory values:  Recent Labs    05/09/23 0500  HGB 10.9*  HCT 34.6*   No results for input(s): "NA", "K", "CL", "CO2", "GLUCOSE", "BUN", "CREATININE", "CALCIUM" in the last 72 hours. No results for input(s): "LABPT", "INR" in the last 72 hours. No results for input(s): "LABURIN" in the last 72 hours. Results for orders placed or performed during the hospital encounter of 11/13/19  SARS CORONAVIRUS 2 (TAT 6-24 HRS) Nasopharyngeal Nasopharyngeal Swab     Status: None   Collection Time: 11/13/19  9:40 AM   Specimen: Nasopharyngeal Swab  Result Value Ref Range Status   SARS Coronavirus 2 NEGATIVE NEGATIVE  Final    Comment: (NOTE) SARS-CoV-2 target nucleic acids are NOT DETECTED. The SARS-CoV-2 RNA is generally detectable in upper and lower respiratory specimens during the acute phase of infection. Negative results do not preclude SARS-CoV-2 infection, do not rule out co-infections with other pathogens, and should not be used as the sole basis for treatment or other patient management decisions. Negative results must be combined with clinical observations, patient history, and epidemiological information. The expected result is Negative. Fact Sheet for Patients: HairSlick.no Fact Sheet for Healthcare Providers: quierodirigir.com This test is not yet approved or cleared by the [place] FDA and  has been authorized for detection and/or diagnosis of SARS-CoV-2 by FDA under an Emergency Use Authorization (EUA). This EUA will remain  in effect (meaning this test can be used) for the duration of the COVID-19 declaration under Section 56 4(b)(1) of the Act, 21 U.S.C. section 360bbb-3(b)(1), unless the authorization is terminated or revoked sooner. Performed at [facility], [address] [place], [place] [address]     Physical Exam  Gen: NAD Resp: Satting well on RA Card: Regular rate Abd: Soft, appropriately tender, ND, incision clean dry and intact GU: Foley catheter in place draining urine. Neuro: Alert   Disposition: Home  Discharge instruction: The patient was instructed to be ambulatory but told to refrain from heavy lifting, strenuous activity, or driving.   Discharge medications:  Allergies as of 05/09/2023       Reactions   Penicillins Anaphylaxis, Itching   20 yrs ago. Face swelling and "throat closes up"    Shellfish Allergy Shortness Of Breath, Swelling   Dust Mite Extract Other (  See Comments)   Other Reaction(s): Other (See Comments)        Medication List     STOP taking these medications     ibuprofen 800 MG tablet Commonly known as: ADVIL   Mounjaro 15 MG/0.5ML Pen Generic drug: tirzepatide   naproxen sodium 220 MG tablet Commonly known as: ALEVE       TAKE these medications    docusate sodium 100 MG capsule Commonly known as: COLACE Take 1 capsule (100 mg total) by mouth 2 (two) times daily.   NON FORMULARY Pt uses a cpap nightly   oxymetazoline 0.05 % nasal spray Commonly known as: AFRIN Place 1 spray into both nostrils 2 (two) times daily as needed for congestion.   rosuvastatin 40 MG tablet Commonly known as: CRESTOR Take 40 mg by mouth daily.   sulfamethoxazole-trimethoprim 800-160 MG tablet Commonly known as: BACTRIM DS Take 1 tablet by mouth 2 (two) times daily. Start the day prior to foley removal appointment   traMADol 50 MG tablet Commonly known as: Ultram Take 1-2 tablets (50-100 mg total) by mouth every 6 (six) hours as needed for moderate pain or severe pain.   vardenafil 20 MG tablet Commonly known as: LEVITRA Take 20 mg by mouth daily as needed for erectile dysfunction.   Voltaren 1 % Gel Generic drug: diclofenac Sodium Apply 2 g topically daily as needed (pain).        Followup:   Follow-up Information     Heloise Purpura, MD Follow up on 05/14/2023.   Specialty: Urology Why: 1245 pm Contact information: [address] [place] [address] [phone]

## 2023-05-08 NOTE — Anesthesia Postprocedure Evaluation (Signed)
Anesthesia Post Note  Patient: [name]  Procedure(s) Performed: XI ROBOTIC ASSISTED LAPAROSCOPIC RADICAL PROSTATECTOMY LEVEL 2 BILATERAL PELVIC LYMPHADENECTOMY (Bilateral)     Patient location during evaluation: PACU Anesthesia Type: General Level of consciousness: awake and alert Pain management: pain level controlled Vital Signs Assessment: post-procedure vital signs reviewed and stable Respiratory status: spontaneous breathing, nonlabored ventilation, respiratory function stable and patient connected to nasal cannula oxygen Cardiovascular status: blood pressure returned to baseline and stable Postop Assessment: no apparent nausea or vomiting Anesthetic complications: no   No notable events documented.  Last Vitals:  Vitals:   05/08/23 0628 05/08/23 1130  BP: 121/75 (!) 143/89  Pulse: 75 88  Resp: 18 19  Temp: 37 C 36.9 C  SpO2: 96% 100%    Last Pain:  Vitals:   05/08/23 1136  TempSrc:   PainSc: 10-Worst pain ever                 Eric Galvan

## 2023-05-08 NOTE — Progress Notes (Signed)
Patient ID: Eric Galvan, male   DOB: Apr 17, 1966, 57 y.o.   MRN: [id number]  Post-op note  Subjective: The patient is doing well.  C/O bladder spasms/rectal spasms.  Objective: Vital signs in last 24 hours: Temp:  [97.8 F (36.6 C)-98.6 F (37 C)] 97.8 F (36.6 C) (07/18 1251) Pulse Rate:  [73-88] 79 (07/18 1251) Resp:  [16-23] 20 (07/18 1251) BP: (121-151)/(75-89) 132/81 (07/18 1251) SpO2:  [96 %-100 %] 97 % (07/18 1251) Weight:  [829 kg] 122 kg (07/18 0602)  Intake/Output from previous day: No intake/output data recorded. Intake/Output this shift: Total I/O In: 3696.3 [P.O.:240; I.V.:2156.3; IV Piggyback:1300] Out: 950 [Urine:500; Drains:100; Blood:350]  Physical Exam:  General: Alert and oriented. Abdomen: Soft, Nondistended. Incisions: Clean and dry. GU: Urine light pink.  Lab Results: Recent Labs    05/08/23 1155  HGB 12.8*  HCT 40.2    Assessment/Plan: POD#0   1) Continue to monitor, ambulate, IS, Levsin for bladder spasms   Eric Galvan. MD   LOS: 0 days   Eric Galvan 05/08/2023, 4:19 PM

## 2023-05-08 NOTE — Anesthesia Procedure Notes (Signed)
Procedure Name: Intubation Date/Time: 05/08/2023 7:21 AM  Performed by: Elyn Peers, CRNAPre-anesthesia Checklist: Patient identified, Emergency Drugs available, Suction available, Patient being monitored and Timeout performed Patient Re-evaluated:Patient Re-evaluated prior to induction Oxygen Delivery Method: Circle system utilized Preoxygenation: Pre-oxygenation with 100% oxygen Induction Type: IV induction Ventilation: Mask ventilation without difficulty Laryngoscope Size: Miller and 3 Grade View: Grade I Tube type: Oral Tube size: 8.0 mm Number of attempts: 1 Airway Equipment and Method: Stylet Placement Confirmation: ETT inserted through vocal cords under direct vision, positive ETCO2 and breath sounds checked- equal and bilateral Secured at: 24 cm Tube secured with: Tape Dental Injury: Teeth and Oropharynx as per pre-operative assessment

## 2023-05-08 NOTE — Transfer of Care (Signed)
Immediate Anesthesia Transfer of Care Note  Patient: Eric Galvan  Procedure(s) Performed: XI ROBOTIC ASSISTED LAPAROSCOPIC RADICAL PROSTATECTOMY LEVEL 2 BILATERAL PELVIC LYMPHADENECTOMY (Bilateral)  Patient Location: PACU  Anesthesia Type:General  Level of Consciousness: awake, alert , and oriented  Airway & Oxygen Therapy: Patient Spontanous Breathing and Patient connected to face mask oxygen  Post-op Assessment: Report given to RN and Post -op Vital signs reviewed and stable  Post vital signs: Reviewed and stable  Last Vitals:  Vitals Value Taken Time  BP 143/89 05/08/23 1130  Temp    Pulse 84 05/08/23 1131  Resp 19 05/08/23 1131  SpO2 100 % 05/08/23 1131  Vitals shown include unfiled device data.  Last Pain:  Vitals:   05/08/23 0628  TempSrc: Oral  PainSc:          Complications: No notable events documented.

## 2023-05-08 NOTE — Discharge Instructions (Signed)

## 2023-05-09 ENCOUNTER — Encounter (HOSPITAL_COMMUNITY): Payer: Self-pay | Admitting: Urology

## 2023-05-09 DIAGNOSIS — E119 Type 2 diabetes mellitus without complications: Secondary | ICD-10-CM | POA: Diagnosis not present

## 2023-05-09 DIAGNOSIS — C61 Malignant neoplasm of prostate: Secondary | ICD-10-CM | POA: Diagnosis not present

## 2023-05-09 DIAGNOSIS — Z79899 Other long term (current) drug therapy: Secondary | ICD-10-CM | POA: Diagnosis not present

## 2023-05-09 LAB — HEMOGLOBIN AND HEMATOCRIT, BLOOD
HCT: 34.6 % — ABNORMAL LOW (ref 39.0–52.0)
Hemoglobin: 10.9 g/dL — ABNORMAL LOW (ref 13.0–17.0)

## 2023-05-09 LAB — GLUCOSE, CAPILLARY
Glucose-Capillary: 112 mg/dL — ABNORMAL HIGH (ref 70–99)
Glucose-Capillary: 120 mg/dL — ABNORMAL HIGH (ref 70–99)
Glucose-Capillary: 125 mg/dL — ABNORMAL HIGH (ref 70–99)
Glucose-Capillary: 131 mg/dL — ABNORMAL HIGH (ref 70–99)

## 2023-05-09 MED ORDER — BISACODYL 10 MG RE SUPP
10.0000 mg | Freq: Once | RECTAL | Status: AC
  Administered 2023-05-09: 10 mg via RECTAL

## 2023-05-09 MED ORDER — BISACODYL 10 MG RE SUPP
RECTAL | Status: AC
  Filled 2023-05-09: qty 1

## 2023-05-09 MED ORDER — TRAMADOL HCL 50 MG PO TABS: 50.0000 mg | ORAL_TABLET | Freq: Four times a day (QID) | ORAL | Status: DC | PRN

## 2023-05-09 NOTE — TOC Progression Note (Signed)
Transition of Care [facility]) - Progression Note    Patient Details  Name: Eric Galvan MRN: [id number] Date of Birth: 08-14-1966  [facility] of [facility]) CM/SW Contact  Geni Bers, RN Phone Number: 05/09/2023, 8:58 AM  Clinical Narrative:      Transition of Care (TOC) Screening Note   Patient Details  Name: [name] Date of Birth: 10-Jan-1966   Transition of Care [facility]) CM/SW Contact:    Geni Bers, RN Phone Number: 05/09/2023, 8:58 AM    Transition of Care Department (TOC) has reviewed patient and no TOC needs have been identified at this time. We will continue to monitor patient advancement through interdisciplinary progression rounds. If new patient transition needs arise, please place a TOC consult.         Expected Discharge Plan and Services                                               Social Determinants of Health (SDOH) Interventions SDOH Screenings   Food Insecurity: No Food Insecurity (05/08/2023)  Housing: Low Risk  (05/08/2023)  Transportation Needs: No Transportation Needs (05/08/2023)  Utilities: Not At Risk (05/08/2023)  Alcohol Screen: Low Risk  (04/21/2023)  Depression (PHQ2-9): Low Risk  (04/21/2023)  Social Connections: Unknown (03/02/2022)   Received from [facility]  Tobacco Use: Medium Risk (05/08/2023)    Readmission Risk Interventions     No data to display

## 2023-05-09 NOTE — Progress Notes (Signed)
Patient discharged home with wife. Discharge instructions given and explained to patient/wife; demonstrated foley/leg bag care to patient/wife, they verbalized understanding. Patient denies any distress. Surgical incision clean and intact, no sign of infection noted. Transported to the car by staff.

## 2023-05-09 NOTE — Plan of Care (Signed)
°  Problem: Education: Goal: Knowledge of the procedure and recovery process will improve Outcome: Adequate for Discharge   Problem: Bowel/Gastric: Goal: Gastrointestinal status for postoperative course will improve Outcome: Adequate for Discharge   Problem: Pain Management: Goal: General experience of comfort will improve Outcome: Adequate for Discharge   Problem: Skin Integrity: Goal: Demonstration of wound healing without infection will improve Outcome: Adequate for Discharge   Problem: Urinary Elimination: Goal: Ability to avoid or minimize complications of infection will improve Outcome: Adequate for Discharge Goal: Ability to achieve and maintain urine output will improve Outcome: Adequate for Discharge Goal: Home care management will improve Outcome: Adequate for Discharge

## 2023-05-09 NOTE — Progress Notes (Signed)
Patient ID: Eric Galvan, male   DOB: [birth year], 57 y.o.   MRN: [id number]  1 Day Post-Op Subjective: The patient is doing well.  No nausea or vomiting. Pain is adequately controlled.  Objective: Vital signs in last 24 hours: Temp:  [97.7 F (36.5 C)-99.1 F (37.3 C)] 99.1 F (37.3 C) (07/19 0100) Pulse Rate:  [73-88] 85 (07/19 0100) Resp:  [16-23] 20 (07/19 0100) BP: (111-151)/(48-89) 113/48 (07/19 0100) SpO2:  [96 %-100 %] 98 % (07/19 0100)  Intake/Output from previous day: 07/18 0701 - 07/19 0700 In: 5835.5 [P.O.:480; I.V.:4055.5; IV Piggyback:1300] Out: 6050 [Urine:5300; Drains:400; Blood:350] Intake/Output this shift: No intake/output data recorded.  Physical Exam:  General: Alert and oriented. CV: RRR Lungs: Clear bilaterally. GI: Soft, Nondistended. Incisions: Clean, dry, and intact Urine: Clear Extremities: Nontender, no erythema, no edema.  Lab Results: Recent Labs    05/08/23 1155 05/09/23 0500  HGB 12.8* 10.9*  HCT 40.2 34.6*      Assessment/Plan: POD# 1 s/p robotic prostatectomy.  1) SL IVF 2) Ambulate, Incentive spirometry 3) Transition to oral pain medication 4) Dulcolax suppository 5) D/C pelvic drain 6) Plan for likely discharge later today   [name]. MD   LOS: 0 days   [name] 05/09/2023, 7:21 AM

## 2023-05-13 ENCOUNTER — Ambulatory Visit: Payer: BC Managed Care – PPO

## 2023-05-13 ENCOUNTER — Ambulatory Visit: Payer: BC Managed Care – PPO | Admitting: Radiation Oncology

## 2023-05-13 LAB — SURGICAL PATHOLOGY

## 2023-05-14 ENCOUNTER — Other Ambulatory Visit (HOSPITAL_BASED_OUTPATIENT_CLINIC_OR_DEPARTMENT_OTHER): Payer: Self-pay

## 2023-05-14 ENCOUNTER — Other Ambulatory Visit (HOSPITAL_COMMUNITY): Payer: Self-pay

## 2023-05-14 MED ORDER — MOUNJARO 15 MG/0.5ML ~~LOC~~ SOAJ
15.0000 mg | SUBCUTANEOUS | 2 refills | Status: DC
  Filled 2023-05-14 – 2023-05-15 (×2): qty 2, 28d supply, fill #0
  Filled 2023-06-20: qty 2, 28d supply, fill #1
  Filled 2023-07-23: qty 2, 28d supply, fill #2

## 2023-05-15 ENCOUNTER — Other Ambulatory Visit (HOSPITAL_COMMUNITY): Payer: Self-pay

## 2023-05-15 ENCOUNTER — Other Ambulatory Visit (HOSPITAL_BASED_OUTPATIENT_CLINIC_OR_DEPARTMENT_OTHER): Payer: Self-pay

## 2023-05-17 DIAGNOSIS — G4733 Obstructive sleep apnea (adult) (pediatric): Secondary | ICD-10-CM | POA: Diagnosis not present

## 2023-05-19 DIAGNOSIS — C61 Malignant neoplasm of prostate: Secondary | ICD-10-CM | POA: Diagnosis not present

## 2023-05-20 DIAGNOSIS — M5416 Radiculopathy, lumbar region: Secondary | ICD-10-CM | POA: Diagnosis not present

## 2023-05-20 DIAGNOSIS — M545 Low back pain, unspecified: Secondary | ICD-10-CM | POA: Diagnosis not present

## 2023-05-23 DIAGNOSIS — M79641 Pain in right hand: Secondary | ICD-10-CM | POA: Diagnosis not present

## 2023-05-23 DIAGNOSIS — G5601 Carpal tunnel syndrome, right upper limb: Secondary | ICD-10-CM | POA: Diagnosis not present

## 2023-05-25 DIAGNOSIS — M545 Low back pain, unspecified: Secondary | ICD-10-CM | POA: Diagnosis not present

## 2023-05-25 DIAGNOSIS — M25552 Pain in left hip: Secondary | ICD-10-CM | POA: Diagnosis not present

## 2023-05-28 DIAGNOSIS — G4733 Obstructive sleep apnea (adult) (pediatric): Secondary | ICD-10-CM | POA: Diagnosis not present

## 2023-06-12 DIAGNOSIS — M5416 Radiculopathy, lumbar region: Secondary | ICD-10-CM | POA: Diagnosis not present

## 2023-06-12 DIAGNOSIS — M545 Low back pain, unspecified: Secondary | ICD-10-CM | POA: Diagnosis not present

## 2023-06-12 DIAGNOSIS — M47896 Other spondylosis, lumbar region: Secondary | ICD-10-CM | POA: Diagnosis not present

## 2023-06-18 DIAGNOSIS — M5416 Radiculopathy, lumbar region: Secondary | ICD-10-CM | POA: Diagnosis not present

## 2023-06-19 DIAGNOSIS — M62838 Other muscle spasm: Secondary | ICD-10-CM | POA: Diagnosis not present

## 2023-06-19 DIAGNOSIS — M6281 Muscle weakness (generalized): Secondary | ICD-10-CM | POA: Diagnosis not present

## 2023-06-19 DIAGNOSIS — N393 Stress incontinence (female) (male): Secondary | ICD-10-CM | POA: Diagnosis not present

## 2023-06-20 ENCOUNTER — Other Ambulatory Visit (HOSPITAL_BASED_OUTPATIENT_CLINIC_OR_DEPARTMENT_OTHER): Payer: Self-pay

## 2023-06-24 ENCOUNTER — Other Ambulatory Visit: Payer: Self-pay

## 2023-06-24 ENCOUNTER — Other Ambulatory Visit (HOSPITAL_BASED_OUTPATIENT_CLINIC_OR_DEPARTMENT_OTHER): Payer: Self-pay

## 2023-06-25 DIAGNOSIS — N393 Stress incontinence (female) (male): Secondary | ICD-10-CM | POA: Diagnosis not present

## 2023-06-25 DIAGNOSIS — M6281 Muscle weakness (generalized): Secondary | ICD-10-CM | POA: Diagnosis not present

## 2023-06-25 DIAGNOSIS — M62838 Other muscle spasm: Secondary | ICD-10-CM | POA: Diagnosis not present

## 2023-06-28 DIAGNOSIS — G4733 Obstructive sleep apnea (adult) (pediatric): Secondary | ICD-10-CM | POA: Diagnosis not present

## 2023-06-30 DIAGNOSIS — M5416 Radiculopathy, lumbar region: Secondary | ICD-10-CM | POA: Diagnosis not present

## 2023-06-30 DIAGNOSIS — M47816 Spondylosis without myelopathy or radiculopathy, lumbar region: Secondary | ICD-10-CM | POA: Diagnosis not present

## 2023-07-19 DIAGNOSIS — G5601 Carpal tunnel syndrome, right upper limb: Secondary | ICD-10-CM | POA: Diagnosis not present

## 2023-07-23 ENCOUNTER — Other Ambulatory Visit (HOSPITAL_BASED_OUTPATIENT_CLINIC_OR_DEPARTMENT_OTHER): Payer: Self-pay

## 2023-07-26 ENCOUNTER — Other Ambulatory Visit (HOSPITAL_BASED_OUTPATIENT_CLINIC_OR_DEPARTMENT_OTHER): Payer: Self-pay

## 2023-07-28 DIAGNOSIS — G4733 Obstructive sleep apnea (adult) (pediatric): Secondary | ICD-10-CM | POA: Diagnosis not present

## 2023-08-01 DIAGNOSIS — G5601 Carpal tunnel syndrome, right upper limb: Secondary | ICD-10-CM | POA: Diagnosis not present

## 2023-08-01 DIAGNOSIS — E1140 Type 2 diabetes mellitus with diabetic neuropathy, unspecified: Secondary | ICD-10-CM | POA: Diagnosis not present

## 2023-08-06 DIAGNOSIS — C61 Malignant neoplasm of prostate: Secondary | ICD-10-CM | POA: Diagnosis not present

## 2023-08-12 DIAGNOSIS — N5201 Erectile dysfunction due to arterial insufficiency: Secondary | ICD-10-CM | POA: Diagnosis not present

## 2023-08-12 DIAGNOSIS — N393 Stress incontinence (female) (male): Secondary | ICD-10-CM | POA: Diagnosis not present

## 2023-08-12 DIAGNOSIS — C61 Malignant neoplasm of prostate: Secondary | ICD-10-CM | POA: Diagnosis not present

## 2023-08-13 ENCOUNTER — Other Ambulatory Visit (HOSPITAL_BASED_OUTPATIENT_CLINIC_OR_DEPARTMENT_OTHER): Payer: Self-pay

## 2023-08-13 DIAGNOSIS — M6281 Muscle weakness (generalized): Secondary | ICD-10-CM | POA: Diagnosis not present

## 2023-08-13 DIAGNOSIS — M62838 Other muscle spasm: Secondary | ICD-10-CM | POA: Diagnosis not present

## 2023-08-13 DIAGNOSIS — N393 Stress incontinence (female) (male): Secondary | ICD-10-CM | POA: Diagnosis not present

## 2023-08-15 DIAGNOSIS — G4733 Obstructive sleep apnea (adult) (pediatric): Secondary | ICD-10-CM | POA: Diagnosis not present

## 2023-08-20 ENCOUNTER — Other Ambulatory Visit (HOSPITAL_COMMUNITY): Payer: Self-pay

## 2023-08-20 DIAGNOSIS — Z23 Encounter for immunization: Secondary | ICD-10-CM | POA: Diagnosis not present

## 2023-08-20 DIAGNOSIS — Z1211 Encounter for screening for malignant neoplasm of colon: Secondary | ICD-10-CM | POA: Diagnosis not present

## 2023-08-20 DIAGNOSIS — C61 Malignant neoplasm of prostate: Secondary | ICD-10-CM | POA: Diagnosis not present

## 2023-08-20 DIAGNOSIS — E669 Obesity, unspecified: Secondary | ICD-10-CM | POA: Diagnosis not present

## 2023-08-20 DIAGNOSIS — E1140 Type 2 diabetes mellitus with diabetic neuropathy, unspecified: Secondary | ICD-10-CM | POA: Diagnosis not present

## 2023-08-20 DIAGNOSIS — Z0001 Encounter for general adult medical examination with abnormal findings: Secondary | ICD-10-CM | POA: Diagnosis not present

## 2023-08-20 DIAGNOSIS — E1165 Type 2 diabetes mellitus with hyperglycemia: Secondary | ICD-10-CM | POA: Diagnosis not present

## 2023-08-20 DIAGNOSIS — Z6834 Body mass index (BMI) 34.0-34.9, adult: Secondary | ICD-10-CM | POA: Diagnosis not present

## 2023-08-20 MED ORDER — MOUNJARO 15 MG/0.5ML ~~LOC~~ SOAJ
15.0000 mg | SUBCUTANEOUS | 3 refills | Status: DC
  Filled 2023-08-20: qty 2, 28d supply, fill #0
  Filled 2023-09-16: qty 2, 28d supply, fill #1
  Filled 2023-10-16: qty 2, 28d supply, fill #2
  Filled 2023-11-10 – 2023-11-27 (×2): qty 2, 28d supply, fill #3

## 2023-08-20 MED ORDER — PROPRANOLOL HCL 60 MG PO TABS
120.0000 mg | ORAL_TABLET | ORAL | 1 refills | Status: AC | PRN
  Filled 2023-08-20: qty 30, 15d supply, fill #0
  Filled 2024-03-17: qty 30, 15d supply, fill #1

## 2023-08-21 ENCOUNTER — Other Ambulatory Visit (HOSPITAL_COMMUNITY): Payer: Self-pay

## 2023-08-23 ENCOUNTER — Other Ambulatory Visit (HOSPITAL_COMMUNITY): Payer: Self-pay

## 2023-08-28 DIAGNOSIS — Z23 Encounter for immunization: Secondary | ICD-10-CM | POA: Diagnosis not present

## 2023-08-28 DIAGNOSIS — E1140 Type 2 diabetes mellitus with diabetic neuropathy, unspecified: Secondary | ICD-10-CM | POA: Diagnosis not present

## 2023-08-28 DIAGNOSIS — I739 Peripheral vascular disease, unspecified: Secondary | ICD-10-CM | POA: Diagnosis not present

## 2023-09-16 ENCOUNTER — Other Ambulatory Visit: Payer: Self-pay

## 2023-10-07 DIAGNOSIS — N393 Stress incontinence (female) (male): Secondary | ICD-10-CM | POA: Diagnosis not present

## 2023-10-07 DIAGNOSIS — M6281 Muscle weakness (generalized): Secondary | ICD-10-CM | POA: Diagnosis not present

## 2023-10-07 DIAGNOSIS — M62838 Other muscle spasm: Secondary | ICD-10-CM | POA: Diagnosis not present

## 2023-10-16 ENCOUNTER — Other Ambulatory Visit (HOSPITAL_COMMUNITY): Payer: Self-pay

## 2023-10-17 ENCOUNTER — Other Ambulatory Visit (HOSPITAL_COMMUNITY): Payer: Self-pay

## 2023-11-13 DIAGNOSIS — G4733 Obstructive sleep apnea (adult) (pediatric): Secondary | ICD-10-CM | POA: Diagnosis not present

## 2023-11-19 DIAGNOSIS — E6609 Other obesity due to excess calories: Secondary | ICD-10-CM | POA: Diagnosis not present

## 2023-11-19 DIAGNOSIS — Z6834 Body mass index (BMI) 34.0-34.9, adult: Secondary | ICD-10-CM | POA: Diagnosis not present

## 2023-11-19 DIAGNOSIS — E1165 Type 2 diabetes mellitus with hyperglycemia: Secondary | ICD-10-CM | POA: Diagnosis not present

## 2023-11-19 DIAGNOSIS — E785 Hyperlipidemia, unspecified: Secondary | ICD-10-CM | POA: Diagnosis not present

## 2023-11-25 ENCOUNTER — Other Ambulatory Visit (HOSPITAL_COMMUNITY): Payer: Self-pay

## 2023-11-27 ENCOUNTER — Other Ambulatory Visit (HOSPITAL_COMMUNITY): Payer: Self-pay

## 2023-11-27 DIAGNOSIS — N393 Stress incontinence (female) (male): Secondary | ICD-10-CM | POA: Diagnosis not present

## 2023-11-27 DIAGNOSIS — M62838 Other muscle spasm: Secondary | ICD-10-CM | POA: Diagnosis not present

## 2023-11-27 DIAGNOSIS — M6281 Muscle weakness (generalized): Secondary | ICD-10-CM | POA: Diagnosis not present

## 2023-12-03 ENCOUNTER — Other Ambulatory Visit (HOSPITAL_BASED_OUTPATIENT_CLINIC_OR_DEPARTMENT_OTHER): Payer: Self-pay

## 2023-12-20 ENCOUNTER — Other Ambulatory Visit (HOSPITAL_COMMUNITY): Payer: Self-pay

## 2023-12-23 ENCOUNTER — Other Ambulatory Visit (HOSPITAL_COMMUNITY): Payer: Self-pay

## 2023-12-23 MED ORDER — MOUNJARO 15 MG/0.5ML ~~LOC~~ SOAJ
15.0000 mg | SUBCUTANEOUS | 3 refills | Status: DC
  Filled 2023-12-23: qty 2, 28d supply, fill #0
  Filled 2024-01-24 – 2024-01-27 (×2): qty 2, 28d supply, fill #1
  Filled 2024-03-17: qty 2, 28d supply, fill #2
  Filled 2024-04-10: qty 2, 28d supply, fill #3

## 2023-12-29 ENCOUNTER — Other Ambulatory Visit (HOSPITAL_COMMUNITY): Payer: Self-pay

## 2024-01-08 DIAGNOSIS — M6281 Muscle weakness (generalized): Secondary | ICD-10-CM | POA: Diagnosis not present

## 2024-01-08 DIAGNOSIS — K5900 Constipation, unspecified: Secondary | ICD-10-CM | POA: Diagnosis not present

## 2024-01-08 DIAGNOSIS — N393 Stress incontinence (female) (male): Secondary | ICD-10-CM | POA: Diagnosis not present

## 2024-01-08 DIAGNOSIS — M62838 Other muscle spasm: Secondary | ICD-10-CM | POA: Diagnosis not present

## 2024-01-24 ENCOUNTER — Other Ambulatory Visit (HOSPITAL_BASED_OUTPATIENT_CLINIC_OR_DEPARTMENT_OTHER): Payer: Self-pay

## 2024-01-27 ENCOUNTER — Other Ambulatory Visit (HOSPITAL_COMMUNITY): Payer: Self-pay

## 2024-01-27 ENCOUNTER — Other Ambulatory Visit (HOSPITAL_BASED_OUTPATIENT_CLINIC_OR_DEPARTMENT_OTHER): Payer: Self-pay

## 2024-02-11 DIAGNOSIS — G4733 Obstructive sleep apnea (adult) (pediatric): Secondary | ICD-10-CM | POA: Diagnosis not present

## 2024-03-11 DIAGNOSIS — M6281 Muscle weakness (generalized): Secondary | ICD-10-CM | POA: Diagnosis not present

## 2024-03-11 DIAGNOSIS — M62838 Other muscle spasm: Secondary | ICD-10-CM | POA: Diagnosis not present

## 2024-03-11 DIAGNOSIS — K5900 Constipation, unspecified: Secondary | ICD-10-CM | POA: Diagnosis not present

## 2024-03-11 DIAGNOSIS — M6289 Other specified disorders of muscle: Secondary | ICD-10-CM | POA: Diagnosis not present

## 2024-03-17 ENCOUNTER — Other Ambulatory Visit (HOSPITAL_COMMUNITY): Payer: Self-pay

## 2024-03-20 ENCOUNTER — Other Ambulatory Visit (HOSPITAL_BASED_OUTPATIENT_CLINIC_OR_DEPARTMENT_OTHER): Payer: Self-pay

## 2024-03-31 ENCOUNTER — Other Ambulatory Visit (HOSPITAL_COMMUNITY): Payer: Self-pay

## 2024-04-10 ENCOUNTER — Other Ambulatory Visit (HOSPITAL_COMMUNITY): Payer: Self-pay

## 2024-04-13 ENCOUNTER — Other Ambulatory Visit (HOSPITAL_COMMUNITY): Payer: Self-pay

## 2024-04-13 DIAGNOSIS — C61 Malignant neoplasm of prostate: Secondary | ICD-10-CM | POA: Diagnosis not present

## 2024-04-20 DIAGNOSIS — N5201 Erectile dysfunction due to arterial insufficiency: Secondary | ICD-10-CM | POA: Diagnosis not present

## 2024-04-20 DIAGNOSIS — C61 Malignant neoplasm of prostate: Secondary | ICD-10-CM | POA: Diagnosis not present

## 2024-04-20 DIAGNOSIS — N393 Stress incontinence (female) (male): Secondary | ICD-10-CM | POA: Diagnosis not present

## 2024-05-08 ENCOUNTER — Other Ambulatory Visit (HOSPITAL_COMMUNITY): Payer: Self-pay

## 2024-05-12 DIAGNOSIS — G4733 Obstructive sleep apnea (adult) (pediatric): Secondary | ICD-10-CM | POA: Diagnosis not present

## 2024-05-18 ENCOUNTER — Other Ambulatory Visit (HOSPITAL_COMMUNITY): Payer: Self-pay

## 2024-05-18 MED ORDER — MOUNJARO 15 MG/0.5ML ~~LOC~~ SOAJ
15.0000 mg | SUBCUTANEOUS | 3 refills | Status: AC
  Filled 2024-05-18 – 2024-06-23 (×4): qty 2, 28d supply, fill #0
  Filled 2024-07-21 (×3): qty 2, 28d supply, fill #1
  Filled 2024-08-28 (×2): qty 2, 28d supply, fill #2
  Filled 2024-10-20 – 2024-11-11 (×2): qty 2, 28d supply, fill #3

## 2024-05-21 NOTE — Procedures (Signed)
 Eric Galvan

## 2024-05-21 NOTE — Procedures (Signed)
 SABRA

## 2024-05-28 ENCOUNTER — Other Ambulatory Visit (HOSPITAL_COMMUNITY): Payer: Self-pay

## 2024-06-12 ENCOUNTER — Other Ambulatory Visit (HOSPITAL_BASED_OUTPATIENT_CLINIC_OR_DEPARTMENT_OTHER): Payer: Self-pay

## 2024-06-22 ENCOUNTER — Other Ambulatory Visit (HOSPITAL_BASED_OUTPATIENT_CLINIC_OR_DEPARTMENT_OTHER): Payer: Self-pay

## 2024-06-23 ENCOUNTER — Other Ambulatory Visit (HOSPITAL_COMMUNITY): Payer: Self-pay

## 2024-07-02 DIAGNOSIS — M62838 Other muscle spasm: Secondary | ICD-10-CM | POA: Diagnosis not present

## 2024-07-02 DIAGNOSIS — K5900 Constipation, unspecified: Secondary | ICD-10-CM | POA: Diagnosis not present

## 2024-07-02 DIAGNOSIS — M6281 Muscle weakness (generalized): Secondary | ICD-10-CM | POA: Diagnosis not present

## 2024-07-02 DIAGNOSIS — N393 Stress incontinence (female) (male): Secondary | ICD-10-CM | POA: Diagnosis not present

## 2024-07-21 ENCOUNTER — Other Ambulatory Visit (HOSPITAL_COMMUNITY): Payer: Self-pay

## 2024-07-22 DIAGNOSIS — H35362 Drusen (degenerative) of macula, left eye: Secondary | ICD-10-CM | POA: Diagnosis not present

## 2024-07-22 DIAGNOSIS — E119 Type 2 diabetes mellitus without complications: Secondary | ICD-10-CM | POA: Diagnosis not present

## 2024-07-22 DIAGNOSIS — H25813 Combined forms of age-related cataract, bilateral: Secondary | ICD-10-CM | POA: Diagnosis not present

## 2024-07-22 DIAGNOSIS — H40013 Open angle with borderline findings, low risk, bilateral: Secondary | ICD-10-CM | POA: Diagnosis not present

## 2024-07-26 ENCOUNTER — Other Ambulatory Visit (HOSPITAL_COMMUNITY): Payer: Self-pay

## 2024-08-09 DIAGNOSIS — M5416 Radiculopathy, lumbar region: Secondary | ICD-10-CM | POA: Diagnosis not present

## 2024-08-09 DIAGNOSIS — M47896 Other spondylosis, lumbar region: Secondary | ICD-10-CM | POA: Diagnosis not present

## 2024-08-28 ENCOUNTER — Other Ambulatory Visit (HOSPITAL_COMMUNITY): Payer: Self-pay

## 2024-09-02 DIAGNOSIS — M5416 Radiculopathy, lumbar region: Secondary | ICD-10-CM | POA: Diagnosis not present

## 2024-09-29 DIAGNOSIS — N393 Stress incontinence (female) (male): Secondary | ICD-10-CM | POA: Diagnosis not present

## 2024-09-29 DIAGNOSIS — M6281 Muscle weakness (generalized): Secondary | ICD-10-CM | POA: Diagnosis not present

## 2024-09-29 DIAGNOSIS — M62838 Other muscle spasm: Secondary | ICD-10-CM | POA: Diagnosis not present

## 2024-10-07 DIAGNOSIS — E785 Hyperlipidemia, unspecified: Secondary | ICD-10-CM | POA: Diagnosis not present

## 2024-10-07 DIAGNOSIS — E1165 Type 2 diabetes mellitus with hyperglycemia: Secondary | ICD-10-CM | POA: Diagnosis not present

## 2024-10-07 DIAGNOSIS — E559 Vitamin D deficiency, unspecified: Secondary | ICD-10-CM | POA: Diagnosis not present

## 2024-10-07 DIAGNOSIS — Z0001 Encounter for general adult medical examination with abnormal findings: Secondary | ICD-10-CM | POA: Diagnosis not present

## 2024-10-20 ENCOUNTER — Other Ambulatory Visit (HOSPITAL_COMMUNITY): Payer: Self-pay

## 2024-10-20 DIAGNOSIS — C61 Malignant neoplasm of prostate: Secondary | ICD-10-CM | POA: Diagnosis not present

## 2024-10-26 ENCOUNTER — Other Ambulatory Visit (HOSPITAL_COMMUNITY): Payer: Self-pay

## 2024-10-27 ENCOUNTER — Other Ambulatory Visit (HOSPITAL_COMMUNITY): Payer: Self-pay

## 2024-11-08 ENCOUNTER — Other Ambulatory Visit (HOSPITAL_COMMUNITY): Payer: Self-pay

## 2024-11-11 ENCOUNTER — Other Ambulatory Visit (HOSPITAL_COMMUNITY): Payer: Self-pay

## 2024-11-13 ENCOUNTER — Other Ambulatory Visit (HOSPITAL_COMMUNITY): Payer: Self-pay
